# Patient Record
Sex: Female | Born: 1985 | Race: White | Hispanic: No | Marital: Married | Smoking: Never smoker
Health system: Southern US, Community
[De-identification: ages and names within clinical notes are randomized; demographics above are authoritative.]

## PROBLEM LIST (undated history)

## (undated) ENCOUNTER — Inpatient Hospital Stay (HOSPITAL_COMMUNITY): Payer: Self-pay

## (undated) DIAGNOSIS — D649 Anemia, unspecified: Secondary | ICD-10-CM

## (undated) DIAGNOSIS — J069 Acute upper respiratory infection, unspecified: Secondary | ICD-10-CM

## (undated) DIAGNOSIS — R51 Headache: Secondary | ICD-10-CM

## (undated) DIAGNOSIS — H9319 Tinnitus, unspecified ear: Secondary | ICD-10-CM

## (undated) DIAGNOSIS — Z8619 Personal history of other infectious and parasitic diseases: Secondary | ICD-10-CM

## (undated) DIAGNOSIS — N390 Urinary tract infection, site not specified: Secondary | ICD-10-CM

## (undated) HISTORY — PX: WISDOM TOOTH EXTRACTION: SHX21

## (undated) HISTORY — DX: Headache: R51

## (undated) HISTORY — DX: Tinnitus, unspecified ear: H93.19

## (undated) HISTORY — DX: Personal history of other infectious and parasitic diseases: Z86.19

## (undated) HISTORY — DX: Acute upper respiratory infection, unspecified: J06.9

## (undated) HISTORY — DX: Urinary tract infection, site not specified: N39.0

## (undated) HISTORY — PX: OTHER SURGICAL HISTORY: SHX169

---
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
## 2005-08-24 ENCOUNTER — Ambulatory Visit (HOSPITAL_COMMUNITY): Admission: RE | Admit: 2005-08-24 | Discharge: 2005-08-24 | Payer: Self-pay | Admitting: Internal Medicine

## 2010-08-07 ENCOUNTER — Inpatient Hospital Stay (HOSPITAL_COMMUNITY)
Admission: AD | Admit: 2010-08-07 | Discharge: 2010-08-10 | Payer: Self-pay | Source: Home / Self Care | Attending: Obstetrics and Gynecology | Admitting: Obstetrics and Gynecology

## 2010-08-15 LAB — CBC
HCT: 36 % (ref 36.0–46.0)
HCT: 29.5 % — ABNORMAL LOW (ref 36.0–46.0)
Hemoglobin: 12.4 g/dL (ref 12.0–15.0)
Hemoglobin: 10.2 g/dL — ABNORMAL LOW (ref 12.0–15.0)
MCH: 31 pg (ref 26.0–34.0)
MCH: 31.3 pg (ref 26.0–34.0)
MCHC: 34.4 g/dL (ref 30.0–36.0)
MCHC: 34.6 g/dL (ref 30.0–36.0)
MCV: 90 fL (ref 78.0–100.0)
MCV: 90.5 fL (ref 78.0–100.0)
Platelets: 177 10*3/uL (ref 150–400)
Platelets: 186 10*3/uL (ref 150–400)
RBC: 4 MIL/uL (ref 3.87–5.11)
RBC: 3.26 MIL/uL — ABNORMAL LOW (ref 3.87–5.11)
RDW: 12.6 % (ref 11.5–15.5)
RDW: 12.8 % (ref 11.5–15.5)
WBC: 9 10*3/uL (ref 4.0–10.5)
WBC: 19.3 10*3/uL — ABNORMAL HIGH (ref 4.0–10.5)

## 2010-08-15 LAB — RPR: RPR Ser Ql: NONREACTIVE

## 2011-08-01 NOTE — L&D Delivery Note (Signed)
After amniotomy the patient rapidly completed the first stage without difficulty.  Second stage was brief. She delivered one live viable white female in ROA position over an intact perineum. Placenta S/I. EBL-400cc. Baby to NBN.

## 2011-08-31 ENCOUNTER — Ambulatory Visit: Payer: Self-pay | Admitting: Family

## 2011-08-31 ENCOUNTER — Encounter: Payer: Self-pay | Admitting: Family

## 2011-08-31 ENCOUNTER — Ambulatory Visit (INDEPENDENT_AMBULATORY_CARE_PROVIDER_SITE_OTHER): Payer: Self-pay | Admitting: Family

## 2011-08-31 VITALS — BP 120/80 | Temp 97.9°F | Ht 61.5 in | Wt 151.0 lb

## 2011-08-31 DIAGNOSIS — L259 Unspecified contact dermatitis, unspecified cause: Secondary | ICD-10-CM

## 2011-08-31 MED ORDER — PREDNISONE 20 MG PO TABS: ORAL_TABLET | ORAL | Status: AC

## 2011-08-31 NOTE — Progress Notes (Signed)
  Subjective:    Patient ID: Angie Sanchez, female    DOB: 01/13/1986, 26 y.o.   MRN: [id number]  HPI 26 year old white female, new patient, and family complaints of a rash generalized all over her body has been present for 2-3 days. He's been taking Benadryl with no relief. Describes the rash as red, itchy, and will. She denies any known allergens. She denies any changes in detergents, soaps, or lotion. She has had a reaction like this in the past, but the source was never identified. Patient denies any lightheadedness, dizziness, chest pain, palpitations, shortness of breath.   Review of Systems  Constitutional: Negative.   Eyes: Negative.   Respiratory: Negative.   Cardiovascular: Negative.   Gastrointestinal: Negative.   Genitourinary: Negative.   Musculoskeletal: Negative.   Skin: Positive for rash.  Neurological: Negative.   Hematological: Negative.   Psychiatric/Behavioral: Negative.    Past Medical History  Diagnosis Date  . Urinary tract infection     History   Social History  . Marital Status: Married    Spouse Name: N/A    Number of Children: N/A  . Years of Education: N/A   Occupational History  . Not on file.   Social History Main Topics  . Smoking status: Never Smoker   . Smokeless tobacco: Not on file  . Alcohol Use: Yes     rarely  . Drug Use: No  . Sexually Active: Not on file   Other Topics Concern  . Not on file   Social History Narrative  . No narrative on file    Past Surgical History  Procedure Date  . Wisdom tooth extraction   . Other surgical history 2001-2002    foot, knee, and finger (granulomas)    Family History  Problem Relation Age of Onset  . Hypertension Mother   . Depression Mother   . Hyperlipidemia Father   . Depression Father   . Arthritis Maternal Grandmother   . Hypertension Maternal Grandmother   . Arthritis Maternal Grandfather   . Hypertension Maternal Grandfather   . Cancer Cousin     breast    No  Known Allergies  No current outpatient prescriptions on file prior to visit.    BP 120/80  Temp(Src) 97.9 F (36.6 C) (Oral)  Ht 5' 1.5" (1.562 m)  Wt 151 lb (68.493 kg)  BMI 28.07 kg/m2chart   Objective:   Physical Exam  Constitutional: She is oriented to person, place, and time. She appears well-developed and well-nourished.  Neck: Normal range of motion. Neck supple.  Cardiovascular: Normal rate, regular rhythm and normal heart sounds.   Pulmonary/Chest: Effort normal and breath sounds normal.  Neurological: She is alert and oriented to person, place, and time.  Skin: Skin is warm and dry. Rash noted. There is erythema.       Generalized rash over the body, there is a red, whelped and excoriated. No signs or symptoms of infection.  Psychiatric: She has a normal mood and affect.          Assessment & Plan:  Assessment: Contact dermatitis  Plan: Prednisone 60 mg by mouth every morning x3 days, 40 mg by mouth every morning x3 days, 20 mg by mouth every morning x3 days. Benadryl when necessary. Drink plenty of fluids. Patient all the office if symptoms worsen or persist. Recheck as scheduled, and when necessary.

## 2011-08-31 NOTE — Patient Instructions (Signed)
Contact Dermatitis Contact dermatitis is a reaction to certain substances that touch the skin. Contact dermatitis can be either irritant contact dermatitis or allergic contact dermatitis. Irritant contact dermatitis does not require previous exposure to the substance for a reaction to occur. Allergic contact dermatitis only occurs if you have been exposed to the substance before. Upon a repeat exposure, your body reacts to the substance.   CAUSES   Many substances can cause contact dermatitis. Irritant dermatitis is most commonly caused by repeated exposure to mildly irritating substances, such as:  Makeup.     Soaps.    Detergents.    Bleaches.    Acids.    Metal salts, such as nickel.  Allergic contact dermatitis is most commonly caused by exposure to:  Poisonous plants.     Chemicals (deodorants, shampoos).     Jewelry.    Latex.    Neomycin in triple antibiotic cream.     Preservatives in products, including clothing.  SYMPTOMS   The area of skin that is exposed may develop:  Dryness or flaking.     Redness.    Cracks.    Itching.    Pain or a burning sensation.     Blisters.  With allergic contact dermatitis, there may also be swelling in areas such as the eyelids, mouth, or genitals.   DIAGNOSIS   Your caregiver can usually tell what the problem is by doing a physical exam. In cases where the cause is uncertain and an allergic contact dermatitis is suspected, a patch skin test may be performed to help determine the cause of your dermatitis. TREATMENT Treatment includes protecting the skin from further contact with the irritating substance by avoiding that substance if possible. Barrier creams, powders, and gloves may be helpful. Your caregiver may also recommend:  Steroid creams or ointments applied 2 times daily. For best results, soak the rash area in cool water for 20 minutes. Then apply the medicine. Cover the area with a plastic wrap. You can store the  steroid cream in the refrigerator for a "chilly" effect on your rash. That may decrease itching. Oral steroid medicines may be needed in more severe cases.     Antibiotics or antibacterial ointments if a skin infection is present.     Antihistamine lotion or an antihistamine taken by mouth to ease itching.     Lubricants to keep moisture in your skin.     Burow's solution to reduce redness and soreness or to dry a weeping rash. Mix one packet or tablet of solution in 2 cups cool water. Dip a clean washcloth in the mixture, wring it out a bit, and put it on the affected area. Leave the cloth in place for 30 minutes. Do this as often as possible throughout the day.     Taking several cornstarch or baking soda baths daily if the area is too large to cover with a washcloth.  Harsh chemicals, such as alkalis or acids, can cause skin damage that is like a burn. You should flush your skin for 15 to 20 minutes with cold water after such an exposure. You should also seek immediate medical care after exposure. Bandages (dressings), antibiotics, and pain medicine may be needed for severely irritated skin.   HOME CARE INSTRUCTIONS  Avoid the substance that caused your reaction.     Keep the area of skin that is affected away from hot water, soap, sunlight, chemicals, acidic substances, or anything else that would irritate your skin.       Do not scratch the rash. Scratching may cause the rash to become infected.     You may take cool baths to help stop the itching.     Only take over-the-counter or prescription medicines as directed by your caregiver.     See your caregiver for follow-up care as directed to make sure your skin is healing properly.  SEEK MEDICAL CARE IF:    Your condition is not better after 3 days of treatment.     You seem to be getting worse.     You see signs of infection such as swelling, tenderness, redness, soreness, or warmth in the affected area.     You have any problems  related to your medicines.  Document Released: 07/14/2000 Document Revised: 03/29/2011 Document Reviewed: 12/20/2010 Exit[employer] Patient Information 2012 [employer]. 

## 2011-10-26 ENCOUNTER — Encounter (INDEPENDENT_AMBULATORY_CARE_PROVIDER_SITE_OTHER): Payer: Self-pay | Admitting: Obstetrics and Gynecology

## 2011-10-26 DIAGNOSIS — IMO0001 Reserved for inherently not codable concepts without codable children: Secondary | ICD-10-CM

## 2011-11-14 LAB — OB RESULTS CONSOLE RPR: RPR: NONREACTIVE

## 2011-11-14 LAB — OB RESULTS CONSOLE HIV ANTIBODY (ROUTINE TESTING): HIV: NONREACTIVE

## 2011-11-15 LAB — OB RESULTS CONSOLE RUBELLA ANTIBODY, IGM: Rubella: IMMUNE

## 2011-11-15 LAB — OB RESULTS CONSOLE GC/CHLAMYDIA
Chlamydia: NEGATIVE
Gonorrhea: NEGATIVE

## 2011-11-15 LAB — OB RESULTS CONSOLE ABO/RH: RH Type: NEGATIVE

## 2011-11-15 LAB — OB RESULTS CONSOLE HEPATITIS B SURFACE ANTIGEN: Hepatitis B Surface Ag: NEGATIVE

## 2011-11-15 LAB — OB RESULTS CONSOLE ANTIBODY SCREEN: Antibody Screen: NEGATIVE

## 2011-11-15 LAB — OB RESULTS CONSOLE HIV ANTIBODY (ROUTINE TESTING): HIV: NONREACTIVE

## 2011-11-15 LAB — OB RESULTS CONSOLE RPR: RPR: NONREACTIVE

## 2012-04-30 LAB — OB RESULTS CONSOLE GBS: GBS: POSITIVE

## 2012-05-17 ENCOUNTER — Encounter (HOSPITAL_COMMUNITY): Payer: Self-pay | Admitting: *Deleted

## 2012-05-17 ENCOUNTER — Telehealth (HOSPITAL_COMMUNITY): Payer: Self-pay | Admitting: *Deleted

## 2012-05-17 NOTE — Telephone Encounter (Signed)
Preadmission screen  

## 2012-05-23 ENCOUNTER — Inpatient Hospital Stay (HOSPITAL_COMMUNITY)
Admission: RE | Admit: 2012-05-23 | Discharge: 2012-05-26 | DRG: 775 | Disposition: A | Discharge status: Home / Self Care | Payer: Medicaid Other | Source: Ambulatory Visit | Attending: Obstetrics and Gynecology | Admitting: Obstetrics and Gynecology

## 2012-05-23 ENCOUNTER — Encounter (HOSPITAL_COMMUNITY): Payer: Self-pay

## 2012-05-23 VITALS — BP 102/63 | HR 90 | Temp 97.3°F | Resp 18 | Ht 62.0 in | Wt 186.0 lb

## 2012-05-23 DIAGNOSIS — Z22330 Carrier of Group B streptococcus: Secondary | ICD-10-CM

## 2012-05-23 DIAGNOSIS — Z3480 Encounter for supervision of other normal pregnancy, unspecified trimester: Secondary | ICD-10-CM

## 2012-05-23 DIAGNOSIS — O99892 Other specified diseases and conditions complicating childbirth: Secondary | ICD-10-CM | POA: Diagnosis present

## 2012-05-23 DIAGNOSIS — O409XX0 Polyhydramnios, unspecified trimester, not applicable or unspecified: Principal | ICD-10-CM | POA: Diagnosis present

## 2012-05-23 LAB — CBC
HCT: 34.5 % — ABNORMAL LOW (ref 36.0–46.0)
MCHC: 32.5 g/dL (ref 30.0–36.0)
MCV: 83.7 fL (ref 78.0–100.0)
Platelets: 218 10*3/uL (ref 150–400)
RDW: 13 % (ref 11.5–15.5)

## 2012-05-23 MED ORDER — ACETAMINOPHEN 325 MG PO TABS: 650.0000 mg | ORAL_TABLET | ORAL | Status: DC | PRN

## 2012-05-23 MED ORDER — PENICILLIN G POTASSIUM 5000000 UNITS IJ SOLR
2.5000 10*6.[IU] | INTRAVENOUS | Status: DC
  Administered 2012-05-23 – 2012-05-24 (×6): 2.5 10*6.[IU] via INTRAVENOUS
  Filled 2012-05-23 (×11): qty 2.5

## 2012-05-23 MED ORDER — LACTATED RINGERS IV SOLN
INTRAVENOUS | Status: DC
  Administered 2012-05-23 – 2012-05-24 (×2): via INTRAVENOUS

## 2012-05-23 MED ORDER — DIPHENHYDRAMINE HCL 50 MG/ML IJ SOLN: 12.5000 mg | INTRAMUSCULAR | Status: DC | PRN

## 2012-05-23 MED ORDER — IBUPROFEN 600 MG PO TABS
600.0000 mg | ORAL_TABLET | Freq: Four times a day (QID) | ORAL | Status: DC | PRN
  Administered 2012-05-24: 600 mg via ORAL
  Filled 2012-05-23: qty 1

## 2012-05-23 MED ORDER — OXYTOCIN BOLUS FROM INFUSION
500.0000 mL | INTRAVENOUS | Status: DC
  Filled 2012-05-23 (×109): qty 500

## 2012-05-23 MED ORDER — EPHEDRINE 5 MG/ML INJ: 10.0000 mg | INTRAVENOUS | Status: DC | PRN

## 2012-05-23 MED ORDER — PHENYLEPHRINE 40 MCG/ML (10ML) SYRINGE FOR IV PUSH (FOR BLOOD PRESSURE SUPPORT): 80.0000 ug | PREFILLED_SYRINGE | INTRAVENOUS | Status: DC | PRN

## 2012-05-23 MED ORDER — CITRIC ACID-SODIUM CITRATE 334-500 MG/5ML PO SOLN: 30.0000 mL | ORAL | Status: DC | PRN

## 2012-05-23 MED ORDER — OXYTOCIN 40 UNITS IN LACTATED RINGERS INFUSION - SIMPLE MED
1.0000 m[IU]/min | INTRAVENOUS | Status: DC
  Administered 2012-05-23: 10 m[IU]/min via INTRAVENOUS
  Administered 2012-05-23: 2 m[IU]/min via INTRAVENOUS
  Filled 2012-05-23: qty 1000

## 2012-05-23 MED ORDER — OXYCODONE-ACETAMINOPHEN 5-325 MG PO TABS
1.0000 | ORAL_TABLET | ORAL | Status: DC | PRN
  Administered 2012-05-24: 1 via ORAL
  Filled 2012-05-23: qty 1

## 2012-05-23 MED ORDER — OXYTOCIN 40 UNITS IN LACTATED RINGERS INFUSION - SIMPLE MED
62.5000 mL/h | INTRAVENOUS | Status: DC
  Administered 2012-05-24: 62.5 mL/h via INTRAVENOUS

## 2012-05-23 MED ORDER — EPHEDRINE 5 MG/ML INJ
10.0000 mg | INTRAVENOUS | Status: DC | PRN
Start: 2012-05-23 — End: 2012-05-24
  Filled 2012-05-23: qty 4

## 2012-05-23 MED ORDER — PENICILLIN G POTASSIUM 5000000 UNITS IJ SOLR
5.0000 10*6.[IU] | Freq: Once | INTRAVENOUS | Status: AC
  Administered 2012-05-23: 5 10*6.[IU] via INTRAVENOUS
  Filled 2012-05-23: qty 5

## 2012-05-23 MED ORDER — LIDOCAINE HCL (PF) 1 % IJ SOLN: 30.0000 mL | INTRAMUSCULAR | Status: DC | PRN

## 2012-05-23 MED ORDER — FENTANYL 2.5 MCG/ML BUPIVACAINE 1/10 % EPIDURAL INFUSION (WH - ANES)
14.0000 mL/h | INTRAMUSCULAR | Status: DC
  Administered 2012-05-24 (×2): 14 mL/h via EPIDURAL
  Filled 2012-05-23 (×3): qty 125

## 2012-05-23 MED ORDER — LACTATED RINGERS IV SOLN: 500.0000 mL | Freq: Once | INTRAVENOUS | Status: DC

## 2012-05-23 MED ORDER — ONDANSETRON HCL 4 MG/2ML IJ SOLN: 4.0000 mg | Freq: Four times a day (QID) | INTRAMUSCULAR | Status: DC | PRN

## 2012-05-23 MED ORDER — LACTATED RINGERS IV SOLN
500.0000 mL | INTRAVENOUS | Status: DC | PRN
  Administered 2012-05-24: 1000 mL via INTRAVENOUS

## 2012-05-23 MED ORDER — TERBUTALINE SULFATE 1 MG/ML IJ SOLN: 0.2500 mg | Freq: Once | INTRAMUSCULAR | Status: AC | PRN

## 2012-05-23 NOTE — Progress Notes (Signed)
FHTs 130-140s with occ mild to moderate variables, good STV, NST R SVE 1-2/50/-2 ballotable, unable to AROM  Continue pitocin.

## 2012-05-23 NOTE — Progress Notes (Signed)
FHTs 130s gstv, NST R, occ mild variable. SVE 2/70/-2, head closer to cervix.  Pt declines careful AROM at this time, continue pit.

## 2012-05-23 NOTE — H&P (Signed)
26 y.o. [redacted]w[redacted]d  G2P1001 comes in for induction at term for polyhydramnios.  Otherwise has good fetal movement and no bleeding.  Past Medical History  Diagnosis Date  . Urinary tract infection   . H/O varicella     Past Surgical History  Procedure Date  . Wisdom tooth extraction   . Other surgical history 2001-2002    foot, knee, and finger (granulomas)  . Nodule removal     from knee fingers and toes    OB History    Grav Para Term Preterm Abortions TAB SAB Ect Mult Living   2 1 1       1      # Outc Date GA Lbr Len/2nd Wgt Sex Del Anes PTL Lv   1 TRM 2012 [redacted]w[redacted]d 15:00 8lb14oz(4.026kg) M SVD None  Yes   2 CUR               History   Social History  . Marital Status: Married    Spouse Name: N/A    Number of Children: N/A  . Years of Education: N/A   Occupational History  . Not on file.   Social History Main Topics  . Smoking status: Never Smoker   . Smokeless tobacco: Never Used  . Alcohol Use: No     rarely  . Drug Use: No  . Sexually Active: Yes   Other Topics Concern  . Not on file   Social History Narrative  . No narrative on file   Review of patient's allergies indicates no known allergies.   Prenatal Course:  Pt had essentially uncomplicated course until about 33 weeks.  At that time fundal height was elevated and AFI was >90%ile at 24.  Antenatal testing ensued weekly and BPPs were reassuring.  The AFI had climbed at one point to 32 but was 29 at last U/S on Tuesday.  Pt and husband are Rh negative and Rhogam had not been given.  An antibody screen for blood type was performed at about 36 weeks again and with TORCH titers; all tests were negative.  U/S was normal at 20 weeks and no hydrops was noted on subsequent U/S.  Growth was 90%ile at last EFW which was done at 33.  Pt had declined all antenatal testing for Downs and Spina Bifida.  Filed Vitals:   05/23/12 0830  Temp: 98.7 F (37.1 C)  Resp: 20     Lungs/Cor:  NAD Abdomen:  soft, gravid Ex:  no  cords, erythema SVE:  2/50/-2, ballotable FHTs:  160s, good STV, NST R; now four mild to moderate variable decels with ctxes. Toco:  q3-4   A/P   Full term with polyhydramnios; induction with pitocin.  GBS pos- pcn.  Angie Sanchez A

## 2012-05-23 NOTE — Progress Notes (Signed)
Dr. Henderson Cloud called in.  Orders received to reduce pitocin infusion to 46mu/min and then increase by 2 mu/min.   Dr. Henderson Cloud notified that pt wishes to see her.  States she will be in later.

## 2012-05-24 ENCOUNTER — Encounter (HOSPITAL_COMMUNITY): Payer: Self-pay

## 2012-05-24 ENCOUNTER — Encounter (HOSPITAL_COMMUNITY): Payer: Self-pay | Admitting: Anesthesiology

## 2012-05-24 ENCOUNTER — Inpatient Hospital Stay (HOSPITAL_COMMUNITY): Payer: Medicaid Other | Admitting: Anesthesiology

## 2012-05-24 MED ORDER — ONDANSETRON HCL 4 MG PO TABS: 4.0000 mg | ORAL_TABLET | ORAL | Status: DC | PRN

## 2012-05-24 MED ORDER — MEASLES, MUMPS & RUBELLA VAC ~~LOC~~ INJ
0.5000 mL | INJECTION | Freq: Once | SUBCUTANEOUS | Status: DC
  Filled 2012-05-24: qty 0.5

## 2012-05-24 MED ORDER — PRENATAL MULTIVITAMIN CH
1.0000 | ORAL_TABLET | Freq: Every day | ORAL | Status: DC
  Administered 2012-05-24 – 2012-05-26 (×3): 1 via ORAL
  Filled 2012-05-24 (×3): qty 1

## 2012-05-24 MED ORDER — IBUPROFEN 600 MG PO TABS
600.0000 mg | ORAL_TABLET | Freq: Four times a day (QID) | ORAL | Status: DC
  Administered 2012-05-24 – 2012-05-26 (×6): 600 mg via ORAL
  Filled 2012-05-24 (×8): qty 1

## 2012-05-24 MED ORDER — LIDOCAINE HCL (PF) 1 % IJ SOLN
INTRAMUSCULAR | Status: DC | PRN
  Administered 2012-05-24 (×2): 5 mL

## 2012-05-24 MED ORDER — DIBUCAINE 1 % RE OINT
1.0000 "application " | TOPICAL_OINTMENT | RECTAL | Status: DC | PRN
  Administered 2012-05-25: 1 via RECTAL
  Filled 2012-05-24: qty 28

## 2012-05-24 MED ORDER — ONDANSETRON HCL 4 MG/2ML IJ SOLN: 4.0000 mg | INTRAMUSCULAR | Status: DC | PRN

## 2012-05-24 MED ORDER — BENZOCAINE-MENTHOL 20-0.5 % EX AERO
1.0000 "application " | INHALATION_SPRAY | CUTANEOUS | Status: DC | PRN
  Filled 2012-05-24: qty 56

## 2012-05-24 MED ORDER — ZOLPIDEM TARTRATE 5 MG PO TABS: 5.0000 mg | ORAL_TABLET | Freq: Every evening | ORAL | Status: DC | PRN

## 2012-05-24 MED ORDER — TETANUS-DIPHTH-ACELL PERTUSSIS 5-2.5-18.5 LF-MCG/0.5 IM SUSP: 0.5000 mL | Freq: Once | INTRAMUSCULAR | Status: DC

## 2012-05-24 MED ORDER — OXYCODONE-ACETAMINOPHEN 5-325 MG PO TABS: 1.0000 | ORAL_TABLET | ORAL | Status: DC | PRN

## 2012-05-24 MED ORDER — SIMETHICONE 80 MG PO CHEW: 80.0000 mg | CHEWABLE_TABLET | ORAL | Status: DC | PRN

## 2012-05-24 MED ORDER — WITCH HAZEL-GLYCERIN EX PADS
1.0000 "application " | MEDICATED_PAD | CUTANEOUS | Status: DC | PRN
  Administered 2012-05-25: 1 via TOPICAL

## 2012-05-24 NOTE — Anesthesia Postprocedure Evaluation (Signed)
  Anesthesia Post Note  Patient: Angie Sanchez  Procedure(s) Performed: * No procedures listed *  Anesthesia type: Epidural  Patient location: Mother/Baby  Post pain: Pain level controlled  Post assessment: Post-op Vital signs reviewed  Last Vitals:  Filed Vitals:   05/24/12 1400  BP: 115/69  Pulse: 78  Temp: 36.7 C  Resp: 18    Post vital signs: Reviewed  Level of consciousness:alert  Complications: No apparent anesthesia complications

## 2012-05-24 NOTE — Anesthesia Procedure Notes (Signed)
Epidural Patient location during procedure: OB Start time: 05/24/2012 3:04 AM  Staffing Anesthesiologist: Brayton Caves R Performed by: anesthesiologist   Preanesthetic Checklist Completed: patient identified, site marked, surgical consent, pre-op evaluation, timeout performed, IV checked, risks and benefits discussed and monitors and equipment checked  Epidural Patient position: sitting Prep: site prepped and draped and DuraPrep Patient monitoring: continuous pulse ox and blood pressure Approach: midline Injection technique: LOR air and LOR saline  Needle:  Needle type: Tuohy  Needle gauge: 17 G Needle length: 9 cm and 9 Needle insertion depth: 5 cm cm Catheter type: closed end flexible Catheter size: 19 Gauge Catheter at skin depth: 10 cm Test dose: negative  Assessment Events: blood not aspirated, injection not painful, no injection resistance, negative IV test and no paresthesia  Additional Notes Patient identified.  Risk benefits discussed including failed block, incomplete pain control, headache, nerve damage, paralysis, blood pressure changes, nausea, vomiting, reactions to medication both toxic or allergic, and postpartum back pain.  Patient expressed understanding and wished to proceed.  All questions were answered.  Sterile technique used throughout procedure and epidural site dressed with sterile barrier dressing. No paresthesia or other complications noted.The patient did not experience any signs of intravascular injection such as tinnitus or metallic taste in mouth nor signs of intrathecal spread such as rapid motor block. Please see nursing notes for vital signs.

## 2012-05-24 NOTE — Progress Notes (Signed)
Delivery of live viable female by Dr Dareen Piano. APGARs 8,9

## 2012-05-24 NOTE — Anesthesia Preprocedure Evaluation (Signed)

## 2012-05-24 NOTE — Progress Notes (Signed)
3.5/80/-2 NST R Pt declined AROM.

## 2012-05-25 LAB — CBC
HCT: 29.3 % — ABNORMAL LOW (ref 36.0–46.0)
Hemoglobin: 9.7 g/dL — ABNORMAL LOW (ref 12.0–15.0)
MCH: 28 pg (ref 26.0–34.0)
MCHC: 33.1 g/dL (ref 30.0–36.0)
RDW: 13.1 % (ref 11.5–15.5)

## 2012-05-25 LAB — ABO/RH: ABO/RH(D): A NEG

## 2012-05-25 MED ORDER — HYDROCORTISONE 1 % EX CREA
TOPICAL_CREAM | Freq: Three times a day (TID) | CUTANEOUS | Status: DC
  Administered 2012-05-26: 02:00:00 via TOPICAL
  Filled 2012-05-25: qty 28

## 2012-05-25 NOTE — Progress Notes (Signed)
PPD#1 Pt without c/o. VSSAF IMP/ doing well Plan/ will discharge in am

## 2012-05-26 NOTE — Progress Notes (Signed)
PPD#2 Pt has no c/o. Ready for discharge. PLAN/ will discharge

## 2012-05-26 NOTE — Discharge Summary (Signed)
Obstetric Discharge Summary Reason for Admission: induction of labor Prenatal Procedures: ultrasound Intrapartum Procedures: spontaneous vaginal delivery Postpartum Procedures: none Complications-Operative and Postpartum: none Hemoglobin  Date Value Range Status  05/25/2012 9.7* 12.0 - 15.0 g/dL Final     HCT  Date Value Range Status  05/25/2012 29.3* 36.0 - 46.0 % Final    Physical Exam:  General: alert Lochia: appropriate Uterine Fundus: firm   Discharge Diagnoses: Term Pregnancy-delivered  Discharge Information: Date: 05/26/2012 Activity: pelvic rest Diet: routine Medications: PNV and Ibuprofen Condition: stable Instructions: refer to practice specific booklet Discharge to: home Follow-up Information    Follow up with Angie Aland, MD. Schedule an appointment as soon as possible for a visit in 4 weeks.   Contact information:   [address] [place] [address] [phone]          Newborn Data: Live born female  Birth Weight: 9 lb 2 oz (4139 g) APGAR: 8, 9  Home with mother.  Sanchez,Angie E 05/26/2012, 8:43 AM

## 2012-05-27 NOTE — Progress Notes (Signed)
Post discharge chart review completed.  

## 2012-05-29 ENCOUNTER — Inpatient Hospital Stay (HOSPITAL_COMMUNITY): Admission: AD | Admit: 2012-05-29 | Payer: Self-pay | Source: Ambulatory Visit | Admitting: Obstetrics and Gynecology

## 2012-10-18 ENCOUNTER — Ambulatory Visit (INDEPENDENT_AMBULATORY_CARE_PROVIDER_SITE_OTHER): Payer: Self-pay | Admitting: Family

## 2012-10-18 ENCOUNTER — Encounter: Payer: Self-pay | Admitting: Family

## 2012-10-18 VITALS — BP 100/60 | HR 79 | Wt 154.0 lb

## 2012-10-18 DIAGNOSIS — R002 Palpitations: Secondary | ICD-10-CM

## 2012-10-18 DIAGNOSIS — I491 Atrial premature depolarization: Secondary | ICD-10-CM

## 2012-10-18 LAB — COMPREHENSIVE METABOLIC PANEL
ALT: 21 U/L (ref 0–35)
AST: 24 U/L (ref 0–37)
Albumin: 4.2 g/dL (ref 3.5–5.2)
Alkaline Phosphatase: 64 U/L (ref 39–117)
Potassium: 4 mEq/L (ref 3.5–5.1)
Sodium: 139 mEq/L (ref 135–145)
Total Bilirubin: 0.7 mg/dL (ref 0.3–1.2)
Total Protein: 7.6 g/dL (ref 6.0–8.3)

## 2012-10-18 LAB — CBC WITH DIFFERENTIAL/PLATELET
Basophils Relative: 0.6 % (ref 0.0–3.0)
Eosinophils Absolute: 0.1 10*3/uL (ref 0.0–0.7)
Lymphocytes Relative: 38.1 % (ref 12.0–46.0)
MCHC: 33.9 g/dL (ref 30.0–36.0)
MCV: 85.1 fl (ref 78.0–100.0)
Monocytes Absolute: 0.3 10*3/uL (ref 0.1–1.0)
Neutrophils Relative %: 52.4 % (ref 43.0–77.0)
Platelets: 237 10*3/uL (ref 150.0–400.0)
RBC: 4.64 Mil/uL (ref 3.87–5.11)
WBC: 4.8 10*3/uL (ref 4.5–10.5)

## 2012-10-18 LAB — TSH: TSH: 1.62 u[IU]/mL (ref 0.35–5.50)

## 2012-10-18 NOTE — Patient Instructions (Addendum)
Premature Beats A premature beat is an extra heartbeat that happens earlier than normal. Premature beats are called premature atrial contractions (PACs) or premature ventricular contractions (PVCs) depending on the area of the heart where they start. CAUSES  Premature beats may be brought on by a variety of factors including:  Emotional stress.  Lack of sleep.  Caffeine.  Asthma medicines.  Stimulants.  Herbal teas.  Dietary supplements.  Alcohol. In most cases, premature beats are not dangerous and are not a sign of serious heart disease. Most patients evaluated for premature beats have completely normal heart function. Rarely, premature beats may be a sign of more significant heart problems or medical illness. SYMPTOMS  Premature beats may cause palpitations. This means you feel like your heart is skipping a beat or beating harder than usual. Sometimes, slight chest pain occurs with premature beats, lasting only a few seconds. This pain has been described as a "flopping" feeling inside the chest. In many cases, premature beats do not cause any symptoms and they are only detected when an electrocardiography test (EKG) or heart monitoring is performed. DIAGNOSIS  Your caregiver may run some tests to evaluate your heart such as an EKG or echocardiography. You may need to wear a portable heart monitor for several days to record the electrical activity of your heart. Blood testing may also be performed to check your electrolytes and thyroid function. TREATMENT  Premature beats usually go away with rest. If the problem continues, your caregiver will determine a treatment plan for you.  HOME CARE INSTRUCTIONS  Get plenty of rest over the next few days until your symptoms improve.  Avoid coffee, tea, alcohol, and soda (pop, cola).  Do not smoke. SEEK MEDICAL CARE IF:  Your symptoms continue after 1 to 2 days of rest.  You have new symptoms, such as chest pain or trouble  breathing. SEEK IMMEDIATE MEDICAL CARE IF:  You have severe chest pain or abdominal pain.  You have pain that radiates into the neck, arm, or jaw.  You faint or have extreme weakness.  You have shortness of breath.  Your heartbeat races for more than 5 seconds. MAKE SURE YOU:  Understand these instructions.  Will watch your condition.  Will get help right away if you are not doing well or get worse. Document Released: 08/24/2004 Document Revised: 10/09/2011 Document Reviewed: 03/20/2011 [employer] Patient Information 2013 [employer].  

## 2012-10-18 NOTE — Progress Notes (Signed)
Subjective:    Patient ID: Angie Sanchez, female    DOB: 12/19/1985, 27 y.o.   MRN: [id number]  HPI 27 year old white female, non-smoker, patient of Dr. Blair Dolphin who presents to the office today with complaints that she has been feeling her heart flutter x 6 days, lasting for brief second at random. She reports she had the same experience while in college approximately 8 years ago and underwent a cardiac work up and wore a heart monitor; reports negative cardiac findings. States fluttering resolved and she has not had them again until a week ago. 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 She reports increased stress with caring for children, is sleeping approximately 5 hours nightly by choice, denies difficulties going to sleep or frequent awakening.     Review of Systems  Constitutional: Negative.   HENT: Negative.   Eyes: Negative.   Respiratory: Negative.   Cardiovascular: Negative.        Denies any episodes of chest pain or SOB.  Gastrointestinal: Negative.   Endocrine: Negative.   Genitourinary: Negative.   Musculoskeletal: Negative.   Skin: Negative.   Neurological: Negative.   Hematological: Negative.   Psychiatric/Behavioral: Negative for suicidal ideas. The patient is nervous/anxious.    Past Medical History  Diagnosis Date  . Urinary tract infection   . H/O varicella     History   Social History  . Marital Status: Married    Spouse Name: N/A    Number of Children: N/A  . Years of Education: N/A   Occupational History  . Not on file.   Social History Main Topics  . Smoking status: Never Smoker   . Smokeless tobacco: Never Used  . Alcohol Use: No     Comment: rarely  . Drug Use: No  . Sexually Active: Yes   Other Topics Concern  . Not on file   Social History Narrative  . No narrative on file    Past Surgical History  Procedure Laterality Date  .  Wisdom tooth extraction    . Other surgical history  2001-2002    foot, knee, and finger (granulomas)  . Nodule removal      from knee fingers and toes    Family History  Problem Relation Age of Onset  . Hypertension Mother   . Depression Mother   . Hyperlipidemia Father   . Depression Father   . Arthritis Maternal Grandmother   . Hypertension Maternal Grandmother   . Arthritis Maternal Grandfather   . Diabetes Maternal Grandfather   . Heart disease Maternal Grandfather   . Hypertension Maternal Grandfather   . Depression Maternal Grandfather   . Cancer Cousin     breast  . Hypertension Paternal Grandmother   . COPD Paternal Grandmother     No Known Allergies  Current Outpatient Prescriptions on File Prior to Visit  Medication Sig Dispense Refill  . acetaminophen (TYLENOL) 325 MG tablet Take 650 mg by mouth every 6 (six) hours as needed. pain      . Prenatal Vit-Fe Fumarate-FA (PRENATAL MULTIVITAMIN) TABS Take 1 tablet by mouth daily.       No current facility-administered medications on file prior to visit.    BP 100/60  Pulse 79  Wt 154 lb (69.854 kg)  BMI 28.16 kg/m2  SpO2 97%chart    Objective:   Physical Exam  Constitutional: She appears  well-developed and well-nourished.  HENT:  Head: Normocephalic.  Right Ear: External ear normal.  Left Ear: External ear normal.  Nose: Nose normal.  Mouth/Throat: Oropharynx is clear and moist.  Eyes: Conjunctivae and EOM are normal. Pupils are equal, round, and reactive to light.  Neck: Normal range of motion. Neck supple.  Cardiovascular: Normal rate, regular rhythm, normal heart sounds and intact distal pulses.   Pulmonary/Chest: Effort normal and breath sounds normal.  Abdominal: Soft. Bowel sounds are normal.  Musculoskeletal: Normal range of motion.  Psychiatric: She has a normal mood and affect. Her behavior is normal. Judgment and thought content normal.   EKG normal sinus rhythm with PAC's.        Assessment & Plan:  Assessment: 1. Palpitations 2. Premature Atrial Contraction  Plan: Ordered BMP, TSH, CBC. Will follow up with patient with pending lab results. Provided information on PAC's and discussed likeliness of PAC's resulting from anxiety, stress, lack of sleep and caffeine. Lifestyle modifications suggested.

## 2013-04-17 ENCOUNTER — Encounter: Payer: Self-pay | Admitting: Family

## 2013-04-17 ENCOUNTER — Ambulatory Visit (INDEPENDENT_AMBULATORY_CARE_PROVIDER_SITE_OTHER): Payer: Self-pay | Admitting: Family

## 2013-04-17 VITALS — BP 100/62 | HR 86 | Wt 142.0 lb

## 2013-04-17 DIAGNOSIS — N926 Irregular menstruation, unspecified: Secondary | ICD-10-CM

## 2013-04-17 DIAGNOSIS — R209 Unspecified disturbances of skin sensation: Secondary | ICD-10-CM

## 2013-04-17 DIAGNOSIS — R200 Anesthesia of skin: Secondary | ICD-10-CM

## 2013-04-17 DIAGNOSIS — M2550 Pain in unspecified joint: Secondary | ICD-10-CM

## 2013-04-18 ENCOUNTER — Encounter: Payer: Self-pay | Admitting: Family

## 2013-04-18 LAB — BASIC METABOLIC PANEL
CO2: 26 mEq/L (ref 19–32)
Calcium: 9.2 mg/dL (ref 8.4–10.5)
Creatinine, Ser: 0.6 mg/dL (ref 0.4–1.2)
GFR: 120.55 mL/min (ref >60.00)

## 2013-04-18 LAB — HCG, QUANTITATIVE, PREGNANCY: hCG, Beta Chain, Quant, S: 0.36 m[IU]/mL

## 2013-04-18 LAB — CBC WITH DIFFERENTIAL/PLATELET
Basophils Absolute: 0 10*3/uL (ref 0.0–0.1)
HCT: 39 % (ref 36.0–46.0)
Hemoglobin: 13.3 g/dL (ref 12.0–15.0)
Lymphs Abs: 2.3 10*3/uL (ref 0.7–4.0)
MCHC: 34.1 g/dL (ref 30.0–36.0)
MCV: 85.3 fl (ref 78.0–100.0)
Monocytes Absolute: 0.5 10*3/uL (ref 0.1–1.0)
Monocytes Relative: 7 % (ref 3.0–12.0)
Neutro Abs: 4.1 10*3/uL (ref 1.4–7.7)
Platelets: 241 10*3/uL (ref 150.0–400.0)
RDW: 13.2 % (ref 11.5–14.6)

## 2013-04-18 NOTE — Progress Notes (Signed)
Subjective:    Patient ID: Angie Sanchez, female    DOB: 04/12/1986, 27 y.o.   MRN: [id number]  HPI  27 year old white female, nonsmoker is in today with complaints multiple joint pain, numbness in arms bilaterally, and hands feel like they have pressure in them ongoing x1-2 weeks. She is 10 months postpartum. Reports doing well. Normal mood. No depression or anxiety no helplessness or hopelessness. Denies any previous symptoms similar.  Review of Systems  Constitutional: Negative.   HENT: Negative.   Respiratory: Negative.   Cardiovascular: Negative.   Gastrointestinal: Negative.   Endocrine: Negative.   Musculoskeletal: Positive for arthralgias. Negative for back pain.  Allergic/Immunologic: Negative.   Neurological: Positive for numbness. Negative for dizziness and weakness.  Hematological: Negative.   Psychiatric/Behavioral: Negative.    Past Medical History  Diagnosis Date  . Urinary tract infection   . H/O varicella     History   Social History  . Marital Status: Married    Spouse Name: N/A    Number of Children: N/A  . Years of Education: N/A   Occupational History  . Not on file.   Social History Main Topics  . Smoking status: Never Smoker   . Smokeless tobacco: Never Used  . Alcohol Use: No     Comment: rarely  . Drug Use: No  . Sexual Activity: Yes   Other Topics Concern  . Not on file   Social History Narrative  . No narrative on file    Past Surgical History  Procedure Laterality Date  . Wisdom tooth extraction    . Other surgical history  2001-2002    foot, knee, and finger (granulomas)  . Nodule removal      from knee fingers and toes    Family History  Problem Relation Age of Onset  . Hypertension Mother   . Depression Mother   . Hyperlipidemia Father   . Depression Father   . Arthritis Maternal Grandmother   . Hypertension Maternal Grandmother   . Arthritis Maternal Grandfather   . Diabetes Maternal Grandfather   . Heart  disease Maternal Grandfather   . Hypertension Maternal Grandfather   . Depression Maternal Grandfather   . Cancer Cousin     breast  . Hypertension Paternal Grandmother   . COPD Paternal Grandmother     No Known Allergies  Current Outpatient Prescriptions on File Prior to Visit  Medication Sig Dispense Refill  . acetaminophen (TYLENOL) 325 MG tablet Take 650 mg by mouth every 6 (six) hours as needed. pain      . Prenatal Vit-Fe Fumarate-FA (PRENATAL MULTIVITAMIN) TABS Take 1 tablet by mouth daily.       No current facility-administered medications on file prior to visit.    BP 100/62  Pulse 86  Wt 142 lb (64.411 kg)  BMI 25.97 kg/m2chart    Objective:   Physical Exam  Constitutional: She is oriented to person, place, and time. She appears well-developed and well-nourished.  HENT:  Right Ear: External ear normal.  Left Ear: External ear normal.  Nose: Nose normal.  Mouth/Throat: Oropharynx is clear and moist.  Neck: Normal range of motion. Neck supple. No thyromegaly present.  Cardiovascular: Normal rate, regular rhythm and normal heart sounds.   Pulmonary/Chest: Effort normal and breath sounds normal.  Musculoskeletal: Normal range of motion.  Neurological: She is alert and oriented to person, place, and time. She has normal reflexes. She displays normal reflexes. No cranial nerve deficit. Coordination normal.  Skin: Skin  is warm and dry.  Psychiatric: She has a normal mood and affect.          Assessment & Plan:  Assessment: 1. Multiple joint pain 2. Bilateral arm numbness  Plan: I have sent to include CBC, TSH, BMP, and they will notify patient and the results. Encouraged healthy diet, exercise. Followup in the results of the labs and sooner as needed.

## 2013-05-26 ENCOUNTER — Encounter: Payer: Self-pay | Admitting: Family

## 2013-05-26 ENCOUNTER — Ambulatory Visit (INDEPENDENT_AMBULATORY_CARE_PROVIDER_SITE_OTHER): Payer: Self-pay | Admitting: Family

## 2013-05-26 VITALS — BP 98/60 | HR 79 | Temp 98.2°F | Wt 137.0 lb

## 2013-05-26 DIAGNOSIS — J322 Chronic ethmoidal sinusitis: Secondary | ICD-10-CM

## 2013-05-26 DIAGNOSIS — J309 Allergic rhinitis, unspecified: Secondary | ICD-10-CM

## 2013-05-26 MED ORDER — FLUTICASONE PROPIONATE 50 MCG/ACT NA SUSP: 2.0000 | Freq: Every day | NASAL | Status: DC

## 2013-05-26 MED ORDER — AMOXICILLIN 500 MG PO TABS: 1000.0000 mg | ORAL_TABLET | Freq: Two times a day (BID) | ORAL | Status: AC

## 2013-05-26 NOTE — Patient Instructions (Signed)

## 2013-05-26 NOTE — Progress Notes (Signed)
Subjective:    Patient ID: Angie Sanchez, female    DOB: Jul 02, 1986, 27 y.o.   MRN: [id number]  HPI 27 year old white female, nonsmoker is in today with complaints of ear pain, nasal congestion, sinus pressure ongoing for 2 weeks. She taken Sudafed and ibuprofen with no relief. Cough is productive with green thick phlegm.   Review of Systems  Constitutional: Negative.   HENT: Positive for congestion, postnasal drip, rhinorrhea and sinus pressure.   Respiratory: Positive for cough.   Cardiovascular: Negative.   Musculoskeletal: Negative.   Skin: Negative.   Allergic/Immunologic: Positive for environmental allergies.  Psychiatric/Behavioral: Negative.    Past Medical History  Diagnosis Date  . Urinary tract infection   . H/O varicella     History   Social History  . Marital Status: Married    Spouse Name: N/A    Number of Children: N/A  . Years of Education: N/A   Occupational History  . Not on file.   Social History Main Topics  . Smoking status: Never Smoker   . Smokeless tobacco: Never Used  . Alcohol Use: No     Comment: rarely  . Drug Use: No  . Sexual Activity: Yes   Other Topics Concern  . Not on file   Social History Narrative  . No narrative on file    Past Surgical History  Procedure Laterality Date  . Wisdom tooth extraction    . Other surgical history  2001-2002    foot, knee, and finger (granulomas)  . Nodule removal      from knee fingers and toes    Family History  Problem Relation Age of Onset  . Hypertension Mother   . Depression Mother   . Hyperlipidemia Father   . Depression Father   . Arthritis Maternal Grandmother   . Hypertension Maternal Grandmother   . Arthritis Maternal Grandfather   . Diabetes Maternal Grandfather   . Heart disease Maternal Grandfather   . Hypertension Maternal Grandfather   . Depression Maternal Grandfather   . Cancer Cousin     breast  . Hypertension Paternal Grandmother   . COPD Paternal  Grandmother     No Known Allergies  Current Outpatient Prescriptions on File Prior to Visit  Medication Sig Dispense Refill  . acetaminophen (TYLENOL) 325 MG tablet Take 650 mg by mouth every 6 (six) hours as needed. pain      . ibuprofen (ADVIL,MOTRIN) 200 MG tablet Take 200 mg by mouth every 6 (six) hours as needed for pain.      . Prenatal Vit-Fe Fumarate-FA (PRENATAL MULTIVITAMIN) TABS Take 1 tablet by mouth daily.       No current facility-administered medications on file prior to visit.    BP 98/60  Pulse 79  Temp(Src) 98.2 F (36.8 C) (Oral)  Wt 137 lb (62.143 kg)  BMI 25.05 kg/m2chart    Objective:   Physical Exam  Constitutional: She is oriented to person, place, and time. She appears well-developed and well-nourished.  HENT:  Right Ear: External ear normal.  Left Ear: External ear normal.  Mouth/Throat: Oropharynx is clear and moist.  Nasal congestion bilaterally. Sinus pressure to palpation over ethmoid sinuses bilaterally  Cardiovascular: Normal rate, regular rhythm and normal heart sounds.   Pulmonary/Chest: Effort normal and breath sounds normal.  Neurological: She is alert and oriented to person, place, and time.  Skin: Skin is warm and dry.  Psychiatric: She has a normal mood and affect.  Assessment & Plan:  Assessment: 1. Ethmoid sinusitis 2. Allergic rhinitis  Plan: Amoxicillin 500 mg 2 caps by mouth twice a day x10 days. Flonase 2 sprays in each nostril once a day. Patient upon the office if symptoms worsen or persist. Recheck as scheduled, and as needed.

## 2013-08-21 ENCOUNTER — Encounter: Payer: Self-pay | Admitting: Family Medicine

## 2013-08-21 ENCOUNTER — Ambulatory Visit (INDEPENDENT_AMBULATORY_CARE_PROVIDER_SITE_OTHER): Payer: Self-pay | Admitting: Family Medicine

## 2013-08-21 VITALS — BP 112/70 | HR 84 | Temp 98.4°F | Wt 147.0 lb

## 2013-08-21 DIAGNOSIS — H6690 Otitis media, unspecified, unspecified ear: Secondary | ICD-10-CM

## 2013-08-21 DIAGNOSIS — H6691 Otitis media, unspecified, right ear: Secondary | ICD-10-CM

## 2013-08-21 MED ORDER — AMOXICILLIN 875 MG PO TABS: 875.0000 mg | ORAL_TABLET | Freq: Two times a day (BID) | ORAL | Status: AC

## 2013-08-21 NOTE — Patient Instructions (Signed)
Otitis Media, Adult Otitis media is redness, soreness, and swelling (inflammation) of the middle ear. Otitis media may be caused by allergies or, most commonly, by infection. Often it occurs as a complication of the common cold. SIGNS AND SYMPTOMS Symptoms of otitis media may include:  Earache.  Fever.  Ringing in your ear.  Headache.  Leakage of fluid from the ear. DIAGNOSIS To diagnose otitis media, your health care provider will examine your ear with an otoscope. This is an instrument that allows your health care provider to see into your ear in order to examine your eardrum. Your health care provider also will ask you questions about your symptoms. TREATMENT  Typically, otitis media resolves on its own within 3 5 days. Your health care provider may prescribe medicine to ease your symptoms of pain. If otitis media does not resolve within 5 days or is recurrent, your health care provider may prescribe antibiotic medicines if he or she suspects that a bacterial infection is the cause. HOME CARE INSTRUCTIONS   Take your medicine as directed until it is gone, even if you feel better after the first few days.  Only take over-the-counter or prescription medicines for pain, discomfort, or fever as directed by your health care provider.  Follow up with your health care provider as directed. SEEK MEDICAL CARE IF:  You have otitis media only in one ear or bleeding from your nose or both.  You notice a lump on your neck.  You are not getting better in 3 5 days.  You feel worse instead of better. SEEK IMMEDIATE MEDICAL CARE IF:   You have pain that is not controlled with medicine.  You have swelling, redness, or pain around your ear or stiffness in your neck.  You notice that part of your face is paralyzed.  You notice that the bone behind your ear (mastoid) is tender when you touch it. MAKE SURE YOU:   Understand these instructions.  Will watch your condition.  Will get help  right away if you are not doing well or get worse. Document Released: 04/21/2004 Document Revised: 05/07/2013 Document Reviewed: 02/11/2013 [employer] Patient Information 2014 [employer].  

## 2013-08-21 NOTE — Progress Notes (Signed)
Pre visit review using our clinic review tool, if applicable. No additional management support is needed unless otherwise documented below in the visit note. 

## 2013-08-21 NOTE — Progress Notes (Signed)
   Subjective:    Patient ID: Angie MeigsLaura E Feely, female    DOB: [birth year], 28 y.o.   MRN: [id number]  HPI  Right ear pain. Onset a couple days ago. She's had history of frequent ear infections in the past. Denies any left ear pain . She has occasional ringing and popping sensation. She has had some nasal congestion for the past few days. Denies any hearing loss. No fevers or chills. No exacerbating factors. Denies any significant cough. No ear drainage.  Past Medical History  Diagnosis Date  . Urinary tract infection   . H/O varicella    Past Surgical History  Procedure Laterality Date  . Wisdom tooth extraction    . Other surgical history  2001-2002    foot, knee, and finger (granulomas)  . Nodule removal      from knee fingers and toes    reports that she has never smoked. She has never used smokeless tobacco. She reports that she does not drink alcohol or use illicit drugs. family history includes Arthritis in her maternal grandfather and maternal grandmother; COPD in her paternal grandmother; Cancer in her cousin; Depression in her father, maternal grandfather, and mother; Diabetes in her maternal grandfather; Heart disease in her maternal grandfather; Hyperlipidemia in her father; Hypertension in her maternal grandfather, maternal grandmother, mother, and paternal grandmother. No Known Allergies   Review of Systems  Constitutional: Negative for fever and chills.  HENT: Positive for ear pain. Negative for ear discharge and hearing loss.   Respiratory: Negative for cough.        Objective:   Physical Exam  Constitutional: She appears well-developed and well-nourished.  HENT:  Left Ear: External ear normal.  Mouth/Throat: Oropharynx is clear and moist.  Moderate erythema right eardrum mostly on the handle of the malleus. No bulging. No visible drainage in the canal.  Neck: Neck supple.  Cardiovascular: Normal rate.   Pulmonary/Chest: Effort normal and breath sounds normal.  No respiratory distress. She has no wheezes. She has no rales.  Lymphadenopathy:    She has no cervical adenopathy.          Assessment & Plan:  Acute right otitis media. Amoxicillin 875 mg twice daily for 10 days

## 2013-09-01 ENCOUNTER — Ambulatory Visit (INDEPENDENT_AMBULATORY_CARE_PROVIDER_SITE_OTHER): Payer: Self-pay | Admitting: Family

## 2013-09-01 ENCOUNTER — Encounter: Payer: Self-pay | Admitting: Family

## 2013-09-01 VITALS — BP 104/60 | HR 105 | Temp 98.6°F | Wt 147.0 lb

## 2013-09-01 DIAGNOSIS — H9209 Otalgia, unspecified ear: Secondary | ICD-10-CM

## 2013-09-01 DIAGNOSIS — H6980 Other specified disorders of Eustachian tube, unspecified ear: Secondary | ICD-10-CM

## 2013-09-01 MED ORDER — METHYLPREDNISOLONE 4 MG PO KIT: PACK | ORAL | Status: DC

## 2013-09-01 NOTE — Progress Notes (Signed)
Pre visit review using our clinic review tool, if applicable. No additional management support is needed unless otherwise documented below in the visit note. 

## 2013-09-01 NOTE — Progress Notes (Signed)
Subjective:    Patient ID: Angie Sanchez, female    DOB: October 14, 1985, 28 y.o.   MRN: [id number]  HPI 28 y.o. White female who presents today with chief complain of "ear infection". Pt states that she was seen 2 weeks ago in office for same complaint and was put on amoxicillin; she has finished the amoxicillin but her ear does not feel better. Pt primary complaint is to her right ear, she describes ringing, popping, tenderness and headaches associated with her right ear. Pt also reports popping feeling to her left ear. Pt denies fever, fatigue, weakness, and weight change.     Review of Systems  Constitutional: Negative.   HENT: Positive for congestion and ear pain.   Eyes: Negative.   Respiratory: Negative.   Cardiovascular: Negative.   Gastrointestinal: Negative.   Endocrine: Negative.   Genitourinary: Negative.   Musculoskeletal: Negative.   Skin: Negative.   Allergic/Immunologic: Negative.   Neurological: Positive for headaches.  Hematological: Negative.   Psychiatric/Behavioral: Negative.    Past Medical History  Diagnosis Date  . Urinary tract infection   . H/O varicella     History   Social History  . Marital Status: Married    Spouse Name: N/A    Number of Children: N/A  . Years of Education: N/A   Occupational History  . Not on file.   Social History Main Topics  . Smoking status: Never Smoker   . Smokeless tobacco: Never Used  . Alcohol Use: No     Comment: rarely  . Drug Use: No  . Sexual Activity: Yes   Other Topics Concern  . Not on file   Social History Narrative  . No narrative on file    Past Surgical History  Procedure Laterality Date  . Wisdom tooth extraction    . Other surgical history  2001-2002    foot, knee, and finger (granulomas)  . Nodule removal      from knee fingers and toes    Family History  Problem Relation Age of Onset  . Hypertension Mother   . Depression Mother   . Hyperlipidemia Father   . Depression Father    . Arthritis Maternal Grandmother   . Hypertension Maternal Grandmother   . Arthritis Maternal Grandfather   . Diabetes Maternal Grandfather   . Heart disease Maternal Grandfather   . Hypertension Maternal Grandfather   . Depression Maternal Grandfather   . Cancer Cousin     breast  . Hypertension Paternal Grandmother   . COPD Paternal Grandmother     No Known Allergies  Current Outpatient Prescriptions on File Prior to Visit  Medication Sig Dispense Refill  . acetaminophen (TYLENOL) 325 MG tablet Take 650 mg by mouth every 6 (six) hours as needed. pain      . fluticasone (FLONASE) 50 MCG/ACT nasal spray Place 2 sprays into the nose daily.  16 g  1  . ibuprofen (ADVIL,MOTRIN) 200 MG tablet Take 200 mg by mouth every 6 (six) hours as needed for pain.       No current facility-administered medications on file prior to visit.    BP 104/60  Pulse 105  Temp(Src) 98.6 F (37 C) (Oral)  Wt 147 lb (66.679 kg)chart    Objective:   Physical Exam  Constitutional: She is oriented to person, place, and time. She appears well-developed and well-nourished. She is active.  HENT:  Head: Normocephalic.  Right Ear: There is tenderness.  Left Ear: Hearing, external ear and  ear canal normal.  Nose: Nose normal.  Mouth/Throat: Uvula is midline, oropharynx is clear and moist and mucous membranes are normal.  Right ear canal is inflamed.   Cardiovascular: Normal rate, regular rhythm and normal heart sounds.   Pulmonary/Chest: Effort normal and breath sounds normal.  Abdominal: Soft. Normal appearance and bowel sounds are normal.  Neurological: She is alert and oriented to person, place, and time.  Skin: Skin is warm, dry and intact.  Psychiatric: She has a normal mood and affect. Her speech is normal and behavior is normal.          Assessment & Plan:  28 y.o. White female presents today with chief complain of "ear infection".  Eustachian Tube Dysfunction: Start Medrol pack p.o. To  decrease inflammation.  Follow up: Pt to follow up as needed or if symptoms do not resolve with completion of medication.

## 2013-09-01 NOTE — Patient Instructions (Signed)
Barotitis Media  Barotitis media is inflammation of your middle ear. This occurs when the auditory tube (eustachian tube) leading from the back of your nose (nasopharynx) to your eardrum is blocked. This blockage may result from a cold, environmental allergies, or an upper respiratory infection. Unresolved barotitis media may lead to damage or hearing loss (barotrauma), which may become permanent.  HOME CARE INSTRUCTIONS   · Use medicines as recommended by your health care provider. Over-the-counter medicines will help unblock the canal and can help during times of air travel.  · Do not put anything into your ears to clean or unplug them. Eardrops will not be helpful.  · Do not swim, dive, or fly until your health care provider says it is all right to do so. If these activities are necessary, chewing gum with frequent, forceful swallowing may help. It is also helpful to hold your nose and gently blow to pop your ears for equalizing pressure changes. This forces air into the eustachian tube.  · Only take over-the-counter or prescription medicines for pain, discomfort, or fever as directed by your health care provider.  · A decongestant may be helpful in decongesting the middle ear and make pressure equalization easier.  SEEK MEDICAL CARE IF:  · You experience a serious form of dizziness in which you feel as if the room is spinning and you feel nauseated (vertigo).  · Your symptoms only involve one ear.  SEEK IMMEDIATE MEDICAL CARE IF:   · You develop a severe headache, dizziness, or severe ear pain.  · You have bloody or pus-like drainage from your ears.  · You develop a fever.  · Your problems do not improve or become worse.  MAKE SURE YOU:   · Understand these instructions.  · Will watch your condition.  · Will get help right away if you are not doing well or get worse.  Document Released: 07/14/2000 Document Revised: 05/07/2013 Document Reviewed: 02/11/2013  [employer]® Patient Information ©2014 [employer].

## 2013-09-08 ENCOUNTER — Ambulatory Visit (INDEPENDENT_AMBULATORY_CARE_PROVIDER_SITE_OTHER): Payer: Self-pay | Admitting: Family Medicine

## 2013-09-08 ENCOUNTER — Encounter: Payer: Self-pay | Admitting: Family Medicine

## 2013-09-08 VITALS — BP 110/70 | HR 84 | Temp 98.4°F | Wt 148.0 lb

## 2013-09-08 DIAGNOSIS — H9209 Otalgia, unspecified ear: Secondary | ICD-10-CM

## 2013-09-08 DIAGNOSIS — H9201 Otalgia, right ear: Secondary | ICD-10-CM

## 2013-09-08 DIAGNOSIS — H6590 Unspecified nonsuppurative otitis media, unspecified ear: Secondary | ICD-10-CM

## 2013-09-08 NOTE — Progress Notes (Signed)
Pre visit review using our clinic review tool, if applicable. No additional management support is needed unless otherwise documented below in the visit note. 

## 2013-09-08 NOTE — Progress Notes (Signed)
   Subjective:    Patient ID: Angie MeigsLaura E Montante, female    DOB: 1985/12/05, 28 y.o.   MRN: [id number]  Otalgia  Pertinent negatives include no ear discharge or headaches.    Persistent right ear discomfort. She was seen about 3 weeks ago and had evidence for erythema and presumed early right supper otitis. She was treated with amoxicillin but did not see any symptomatic improvement. She was subsequently seen and diagnosed with eustachian tube dysfunction and given prednisone taper which also has not helped. She has persistent right ear pain. No vertigo. No hearing changes. She has occasional tenderness. No left ear symptoms. Denies any significant nasal congestive symptoms. She's not had a history of recurrent otitis in the past  Past Medical History  Diagnosis Date  . Urinary tract infection   . H/O varicella    Past Surgical History  Procedure Laterality Date  . Wisdom tooth extraction    . Other surgical history  2001-2002    foot, knee, and finger (granulomas)  . Nodule removal      from knee fingers and toes    reports that she has never smoked. She has never used smokeless tobacco. She reports that she does not drink alcohol or use illicit drugs. family history includes Arthritis in her maternal grandfather and maternal grandmother; COPD in her paternal grandmother; Cancer in her cousin; Depression in her father, maternal grandfather, and mother; Diabetes in her maternal grandfather; Heart disease in her maternal grandfather; Hyperlipidemia in her father; Hypertension in her maternal grandfather, maternal grandmother, mother, and paternal grandmother. No Known Allergies   Review of Systems  Constitutional: Negative for fever and chills.  HENT: Positive for ear pain. Negative for ear discharge.   Neurological: Negative for headaches.       Objective:   Physical Exam  Constitutional: She appears well-developed and well-nourished.  HENT:  Mouth/Throat: Oropharynx is clear  and moist.  Left eardrum appears normal. Right canal is clear. There is no cerumen. Question small serous effusion. She has slightly amber colored fluid behind the eardrum. Landmarks are preserved.          Assessment & Plan:  Persistent right ear pain. Question serous effusion. We explained effusions can sometimes take several months to resolve. She is requesting ENT referral and we'll set up. We've not recommended additional medications at this time. She does not have any supperative changes at this time

## 2013-09-08 NOTE — Patient Instructions (Signed)
Serous Otitis Media  Serous otitis media is fluid in the middle ear space. This space contains the bones for hearing and air. Air in the middle ear space helps to transmit sound.  The air gets there through the eustachian tube. This tube goes from the back of the nose (nasopharynx) to the middle ear space. It keeps the pressure in the middle ear the same as the outside world. It also helps to drain fluid from the middle ear space. CAUSES  Serous otitis media occurs when the eustachian tube gets blocked. Blockage can come from:  Ear infections.  Colds and other upper respiratory infections.  Allergies.  Irritants such as cigarette smoke.  Sudden changes in air pressure (such as descending in an airplane).  Enlarged adenoids.  A mass in the nasopharynx. During colds and upper respiratory infections, the middle ear space can become temporarily filled with fluid. This can happen after an ear infection also. Once the infection clears, the fluid will generally drain out of the ear through the eustachian tube. If it does not, then serous otitis media occurs. SIGNS AND SYMPTOMS   Hearing loss.  A feeling of fullness in the ear, without pain.  Young children may not show any symptoms but may show slight behavioral changes, such as agitation, ear pulling, or crying. DIAGNOSIS  Serous otitis media is diagnosed by an ear exam. Tests may be done to check on the movement of the eardrum. Hearing exams may also be done. TREATMENT  The fluid most often goes away without treatment. If allergy is the cause, allergy treatment may be helpful. Fluid that persists for several months may require minor surgery. A small tube is placed in the eardrum to:  Drain the fluid.  Restore the air in the middle ear space. In certain situations, antibiotics are used to avoid surgery. Surgery may be done to remove enlarged adenoids (if this is the cause). HOME CARE INSTRUCTIONS   Keep children away from tobacco  smoke.  Be sure to keep any follow-up appointments. SEEK MEDICAL CARE IF:   Your hearing is not better in 3 months.  Your hearing is worse.  You have ear pain.  You have drainage from the ear.  You have dizziness.  You have serous otitis media only in one ear or have any bleeding from your nose (epistaxis).  You notice a lump on your neck. MAKE SURE YOU:  Understand these instructions.   Will watch your condition.   Will get help right away if you are not doing well or get worse.  Document Released: 10/07/2003 Document Revised: 03/19/2013 Document Reviewed: 02/11/2013 [employer] Patient Information 2014 [employer].  

## 2013-11-19 ENCOUNTER — Ambulatory Visit (INDEPENDENT_AMBULATORY_CARE_PROVIDER_SITE_OTHER): Payer: BC Managed Care – PPO | Admitting: Family

## 2013-11-19 ENCOUNTER — Encounter: Payer: Self-pay | Admitting: Family

## 2013-11-19 VITALS — BP 102/80 | HR 90 | Temp 99.0°F | Wt 148.0 lb

## 2013-11-19 DIAGNOSIS — J040 Acute laryngitis: Secondary | ICD-10-CM

## 2013-11-19 DIAGNOSIS — J309 Allergic rhinitis, unspecified: Secondary | ICD-10-CM

## 2013-11-19 MED ORDER — FEXOFENADINE-PSEUDOEPHED ER 180-240 MG PO TB24: 1.0000 | ORAL_TABLET | Freq: Every day | ORAL | Status: DC

## 2013-11-19 NOTE — Progress Notes (Signed)
Pre visit review using our clinic review tool, if applicable. No additional management support is needed unless otherwise documented below in the visit note. 

## 2013-11-19 NOTE — Progress Notes (Signed)
Subjective:    Patient ID: Angie Sanchez, female    DOB: 12/05/1985, 28 y.o.   MRN: [id number]  HPI 28 year old white female, nonsmoker than today with complaints of sore throat, cough, congestion x5 days. She has not taken any medication over-the-counter. Denies any fever or chills. Has a history of allergic rhinitis.   Review of Systems  Constitutional: Negative.   HENT: Positive for congestion and sore throat.   Respiratory: Negative.   Cardiovascular: Negative.   Endocrine: Negative.   Genitourinary: Negative.   Musculoskeletal: Negative.   Skin: Negative.   Neurological: Negative.   Psychiatric/Behavioral: Negative.    Past Medical History  Diagnosis Date  . Urinary tract infection   . H/O varicella     History   Social History  . Marital Status: Married    Spouse Name: N/A    Number of Children: N/A  . Years of Education: N/A   Occupational History  . Not on file.   Social History Main Topics  . Smoking status: Never Smoker   . Smokeless tobacco: Never Used  . Alcohol Use: No     Comment: rarely  . Drug Use: No  . Sexual Activity: Yes   Other Topics Concern  . Not on file   Social History Narrative  . No narrative on file    Past Surgical History  Procedure Laterality Date  . Wisdom tooth extraction    . Other surgical history  2001-2002    foot, knee, and finger (granulomas)  . Nodule removal      from knee fingers and toes    Family History  Problem Relation Age of Onset  . Hypertension Mother   . Depression Mother   . Hyperlipidemia Father   . Depression Father   . Arthritis Maternal Grandmother   . Hypertension Maternal Grandmother   . Arthritis Maternal Grandfather   . Diabetes Maternal Grandfather   . Heart disease Maternal Grandfather   . Hypertension Maternal Grandfather   . Depression Maternal Grandfather   . Cancer Cousin     breast  . Hypertension Paternal Grandmother   . COPD Paternal Grandmother     No Known  Allergies  Current Outpatient Prescriptions on File Prior to Visit  Medication Sig Dispense Refill  . ibuprofen (ADVIL,MOTRIN) 200 MG tablet Take 200 mg by mouth every 6 (six) hours as needed for pain.       No current facility-administered medications on file prior to visit.    BP 102/80  Pulse 90  Temp(Src) 99 F (37.2 C) (Oral)  Wt 148 lb (67.132 kg)  SpO2 95%chart    Objective:   Physical Exam  Constitutional: She is oriented to person, place, and time. She appears well-developed and well-nourished.  HENT:  Right Ear: External ear normal.  Left Ear: External ear normal.  Nose: Nose normal.  Pharynx moderately red  Neck: Normal range of motion. Neck supple.  Cardiovascular: Normal rate and normal heart sounds.   Pulmonary/Chest: Effort normal and breath sounds normal.  Musculoskeletal: Normal range of motion.  Neurological: She is alert and oriented to person, place, and time.  Skin: Skin is warm and dry.  Psychiatric: She has a normal mood and affect.          Assessment & Plan:  Vernona RiegerLaura was seen today for sore throat and laryngitis.  Diagnoses and associated orders for this visit:  Laryngitis  Allergic rhinitis  Other Orders - fexofenadine-pseudoephedrine (ALLEGRA-D 24) 180-240 MG per 24 hr  tablet; Take 1 tablet by mouth daily.    call the office with any questions or concerns. Recheck as scheduled and as needed.

## 2013-11-19 NOTE — Patient Instructions (Signed)
Laryngitis At the top of your windpipe is your voice box. It is the source of your voice. Inside your voice box are 2 bands of muscles called vocal cords. When you breathe, your vocal cords are relaxed and open so that air can get into the lungs. When you decide to say something, these cords come together and vibrate. The sound from these vibrations goes into your throat and comes out through your mouth as sound. Laryngitis is an inflammation of the vocal cords that causes hoarseness, cough, loss of voice, sore throat, and dry throat. Laryngitis can be temporary (acute) or long-term (chronic). Most cases of acute laryngitis improve with time.Chronic laryngitis lasts for more than 3 weeks. CAUSES Laryngitis can often be related to excessive smoking, talking, or yelling, as well as inhalation of toxic fumes and allergies. Acute laryngitis is usually caused by a viral infection, vocal strain, measles or mumps, or bacterial infections. Chronic laryngitis is usually caused by vocal cord strain, vocal cord injury, postnasal drip, growths on the vocal cords, or acid reflux. SYMPTOMS   Cough.  Sore throat.  Dry throat. RISK FACTORS  Respiratory infections.  Exposure to irritating substances, such as cigarette smoke, excessive amounts of alcohol, stomach acids, and workplace chemicals.  Voice trauma, such as vocal cord injury from shouting or speaking too loud. DIAGNOSIS  Your cargiver will perform a physical exam. During the physical exam, your caregiver will examine your throat. The most common sign of laryngitis is hoarseness. Laryngoscopy may be necessary to confirm the diagnosis of this condition. This procedure allows your caregiver to look into the larynx. HOME CARE INSTRUCTIONS  Drink enough fluids to keep your urine clear or pale yellow.  Rest until you no longer have symptoms or as directed by your caregiver.  Breathe in moist air.  Take all medicine as directed by your  caregiver.  Do not smoke.  Talk as little as possible (this includes whispering).  Write on paper instead of talking until your voice is back to normal.  Follow up with your caregiver if your condition has not improved after 10 days. SEEK MEDICAL CARE IF:   You have trouble breathing.  You cough up blood.  You have persistent fever.  You have increasing pain.  You have difficulty swallowing. MAKE SURE YOU:  Understand these instructions.  Will watch your condition.  Will get help right away if you are not doing well or get worse. Document Released: 07/17/2005 Document Revised: 10/09/2011 Document Reviewed: 09/22/2010 Exit[employer] Patient Information 2014 [employer].  

## 2014-01-12 ENCOUNTER — Telehealth: Payer: Self-pay | Admitting: Family

## 2014-01-12 DIAGNOSIS — H9319 Tinnitus, unspecified ear: Secondary | ICD-10-CM

## 2014-01-12 NOTE — Telephone Encounter (Signed)
Please advise 

## 2014-01-12 NOTE — Telephone Encounter (Signed)
Per Dr Dorma Russellkraus office [facility] [place] states they have spoken to the patient this morning and told her that they can not treat her for  The dizziness and tinnitus she will have to see a neurologist first . Appointment will not be made for the patient she would have to see a neurologist , and Dr Lovey NewcomerKrause office spoke with patient mother and informed her of this as well , when they called their office this morning

## 2014-01-12 NOTE — Addendum Note (Signed)
Addended byAdline Mango: CAMPBELL, PADONDA B on: 01/12/2014 11:24 AM   Modules accepted: Orders

## 2014-01-12 NOTE — Telephone Encounter (Signed)
Referral done

## 2014-01-12 NOTE — Telephone Encounter (Signed)
Pt is requesting a referral to the [facility] [place], dr Lovey NewcomerKrause,  Pt states she has ongoing ear issues, has been seen several times for her constant ear ringing . This has been going on 5 mos. pls advise Pt is requesting an appt w/ ear center w./in next week

## 2014-01-28 ENCOUNTER — Encounter: Payer: Self-pay | Admitting: Neurology

## 2014-01-28 ENCOUNTER — Ambulatory Visit (INDEPENDENT_AMBULATORY_CARE_PROVIDER_SITE_OTHER): Payer: BC Managed Care – PPO | Admitting: Neurology

## 2014-01-28 VITALS — BP 110/60 | HR 68 | Temp 99.2°F | Resp 18 | Ht 63.0 in | Wt 153.2 lb

## 2014-01-28 DIAGNOSIS — H9319 Tinnitus, unspecified ear: Secondary | ICD-10-CM

## 2014-01-28 DIAGNOSIS — R51 Headache: Secondary | ICD-10-CM

## 2014-01-28 DIAGNOSIS — R519 Headache, unspecified: Secondary | ICD-10-CM

## 2014-01-28 DIAGNOSIS — H9313 Tinnitus, bilateral: Secondary | ICD-10-CM

## 2014-01-28 NOTE — Patient Instructions (Addendum)
Since it occurred following ear infections, I would assume it is related to that.  If it is neurological, the nerve in the ears may have been damaged by the infection.  I doubt it is related to a problem in the brain itself. We will get MRI of the brain and internal auditory canals with and without contrast.  [facility] 02/04/14 4:45pm  I will contact you with results.  If there is anything suspicious that requires further testing or evaluation, we will set that up.

## 2014-01-28 NOTE — Progress Notes (Signed)
NEUROLOGY CONSULTATION NOTE  Angie MeigsLaura E Sanchez MRN: [id number] DOB: 09/11/1985  Referring provider: Adline MangoPadonda Campbell, FNP Primary care provider: Adline MangoPadonda Campbell, FNP  Reason for consult:  tinnitus  HISTORY OF PRESENT ILLNESS: Angie HackerLaura Sanchez is a 28 year old right-handed woman who presents for tinnitus.  Records were reviewed.  This past winter, she had frequent ear infections.  In January, she developed right ear pain.  She noted right aural fullness.  She described an occasional ringing and popping sensation.  Initially, there was some associated nasal congestion.  She had a little fever.  She denied hearing loss or ear drainage.  She was treated at that time with amoxicillin.  The ringing and popping sound continued.  She developed tenderness around her right ear.  She then developed popping sound in her left ear, but no pain or discomfort like the right ear.  She continued to have nasal congestion.  She was prescribed a Medrol dose pack to decrease inflammation of her Eustachian tube.  The symptoms persisted, although nasal congestion resolved.  Follow-up examination by her primary care provider suggested possible small serous effusion, described as "slightly amber colored fluid behind the eardrum".  Following treatment for the otitis media, she developed persistent high-pitched ringing in both ears, as well as a "hissing" sound in the right ear.  The ringing is worse at night.  Her right ear still feels stuffy. She denies hearing loss and facial numbness.  She denies vertigo but sometimes briefly feels unsteady on her feet. She was referred to ENT, and she reportedly had a normal workup.  No new medications.  She has a history of headache as well, which is separate from the tinnitus.  She began having headaches occasionally over the past few years.  It is located on the mid-suboccipital region and radiates to the top of the head.  It is a 6/10 throbbing sensation.  Maybe on rare occasions,  it may be associated with nausea, but she usually has no associated symptoms.  It is easily treated with Advil and resolves within 30 minutes.  It occurs 2-3 times a month.  There has been no change in quality or frequency of these headaches over the past few months.  The only thing different is that she occasionally has a tingling sensation in the same distribution of the posterior headache.  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  She suddenly developed visual disturbance, described as zig-zag lines in both visual fields.  It lasted about 10 minutes and resolved without any headache.  Occasionally, she will note brief flashing lights.  She was recently given a mild prescription for reading glasses but no recent significant visual impairment.  PAST MEDICAL HISTORY: Past Medical History  Diagnosis Date  . Urinary tract infection   . H/O varicella   . Headache(784.0)     PAST SURGICAL HISTORY: Past Surgical History  Procedure Laterality Date  . Wisdom tooth extraction    . Other surgical history  2001-2002    foot, knee, and finger (granulomas)  . Nodule removal      from knee fingers and toes    MEDICATIONS: Current Outpatient Prescriptions on File Prior to Visit  Medication Sig Dispense Refill  . ibuprofen (ADVIL,MOTRIN) 200 MG tablet Take 200 mg by mouth every 6 (six) hours as needed for pain.      . fexofenadine-pseudoephedrine (ALLEGRA-D 24) 180-240 MG per 24 hr tablet Take 1 tablet by mouth daily.  30 tablet  2   No current facility-administered medications on file prior to visit.    ALLERGIES: No Known Allergies  FAMILY HISTORY: Family History  Problem Relation Age of Onset  . Hypertension Mother   . Depression Mother   . Hyperlipidemia Father   . Depression Father   . Arthritis Maternal Grandmother   . Hypertension Maternal Grandmother   . Arthritis Maternal Grandfather   . Diabetes Maternal Grandfather   . Heart disease Maternal  Grandfather   . Hypertension Maternal Grandfather   . Depression Maternal Grandfather   . Cancer Cousin     breast  . Hypertension Paternal Grandmother   . COPD Paternal Grandmother     SOCIAL HISTORY: History   Social History  . Marital Status: Married    Spouse Name: N/A    Number of Children: N/A  . Years of Education: N/A   Occupational History  . Not on file.   Social History Main Topics  . Smoking status: Never Smoker   . Smokeless tobacco: Never Used  . Alcohol Use: No     Comment: rarely  . Drug Use: No  . Sexual Activity: Yes    Partners: Male   Other Topics Concern  . Not on file   Social History Narrative  . No narrative on file    REVIEW OF SYSTEMS: Constitutional: No fevers, chills, or sweats, no generalized fatigue, change in appetite Eyes: No visual changes, double vision, eye pain Ear, nose and throat: No hearing loss, ear pain, nasal congestion, sore throat Cardiovascular: No chest pain, palpitations Respiratory:  No shortness of breath at rest or with exertion, wheezes GastrointestinaI: No nausea, vomiting, diarrhea, abdominal pain, fecal incontinence Genitourinary:  No dysuria, urinary retention or frequency Musculoskeletal:  No neck pain, back pain Integumentary: No rash, pruritus, skin lesions Neurological: as above Psychiatric: No depression, insomnia, anxiety Endocrine: No palpitations, fatigue, diaphoresis, mood swings, change in appetite, change in weight, increased thirst Hematologic/Lymphatic:  No anemia, purpura, petechiae. Allergic/Immunologic: no itchy/runny eyes, nasal congestion, recent allergic reactions, rashes  PHYSICAL EXAM: Filed Vitals:   01/28/14 1053  BP: 110/60  Pulse: 68  Temp: 99.2 F (37.3 C)  Resp: 18   General: No acute distress Head:  Normocephalic/atraumatic, internal auditory canals and TMs unremarkable. Neck: supple, no paraspinal tenderness, full range of motion Back: No paraspinal tenderness Heart:  regular rate and rhythm Lungs: Clear to auscultation bilaterally. Vascular: No carotid bruits. Neurological Exam: Mental status: alert and oriented to person, place, and time, recent and remote memory intact, fund of knowledge intact, attention and concentration intact, speech fluent and not dysarthric, language intact. Cranial nerves: CN I: not tested CN II: pupils equal, round and reactive to light, visual fields intact, fundi unremarkable, without vessel changes, exudates, hemorrhages or papilledema. CN III, IV, VI:  full range of motion, no nystagmus, no ptosis CN V: facial sensation intact CN VII: upper and lower face symmetric CN VIII: hearing intact CN IX, X: gag intact, uvula midline CN XI: sternocleidomastoid and trapezius muscles intact CN XII: tongue midline Bulk & Tone: normal, no fasciculations. Motor: 5 out of 5 throughout Sensation: Temperature and vibration intact Deep Tendon Reflexes: 2+ throughout, toes downgoing Finger to nose testing: No dysmetria Heel to shin: No dysmetria Gait: Normal station and stride. Able to turn and walk in tandem. Romberg negative.  IMPRESSION: 1.  Tinnitus, following otitis media.  History would suggest an inner ear problem or possible injury of the auditory nerve due to infection .  I  do not have the notes from ENT, however.   2.  Episodic headache.  This is a chronic issue and unchanged.  It is not related to the above.  PLAN: Would get MRI of the brain and internal auditory canals with and without contrast.  If study is indicative of an intracranial abnormality, further management will be pursued.  Otherwise, it would be supportive care for tinnitus (such as white noise machine or cognitive behavioral therapy).  45 minutes spent with the patient, over 50% spent counseling and coordinating care.  Thank you for allowing me to take part in the care of this patient.  Shon MilletAdam Jaffe, DO  CC:  Adline MangoPadonda Campbell, FNP

## 2014-02-04 ENCOUNTER — Ambulatory Visit (HOSPITAL_COMMUNITY): Payer: BC Managed Care – PPO

## 2014-06-01 ENCOUNTER — Encounter: Payer: Self-pay | Admitting: Neurology

## 2014-06-12 ENCOUNTER — Telehealth: Payer: Self-pay | Admitting: Family

## 2014-06-12 DIAGNOSIS — J309 Allergic rhinitis, unspecified: Secondary | ICD-10-CM

## 2014-06-12 NOTE — Telephone Encounter (Signed)
[facility] and they did not feel her problems were related to her ears. She would like a referral to Dr Irena CordsVan Winkle. Has been taking Claritin daily. Can't go a day w/o taking and thinks it is time she sees a Proofreaderallergist.

## 2014-06-16 LAB — OB RESULTS CONSOLE HEPATITIS B SURFACE ANTIGEN: Hepatitis B Surface Ag: NEGATIVE

## 2014-06-16 LAB — OB RESULTS CONSOLE GC/CHLAMYDIA
Chlamydia: NEGATIVE
Gonorrhea: NEGATIVE

## 2014-06-16 LAB — OB RESULTS CONSOLE HIV ANTIBODY (ROUTINE TESTING): HIV: NONREACTIVE

## 2014-06-16 LAB — OB RESULTS CONSOLE ABO/RH: RH Type: NEGATIVE

## 2014-06-16 LAB — OB RESULTS CONSOLE RUBELLA ANTIBODY, IGM: Rubella: IMMUNE

## 2014-06-16 LAB — OB RESULTS CONSOLE RPR: RPR: NONREACTIVE

## 2014-06-16 LAB — OB RESULTS CONSOLE ANTIBODY SCREEN: ANTIBODY SCREEN: NEGATIVE

## 2014-06-16 NOTE — Telephone Encounter (Signed)
Referral placed.

## 2014-07-21 ENCOUNTER — Ambulatory Visit (INDEPENDENT_AMBULATORY_CARE_PROVIDER_SITE_OTHER): Payer: BC Managed Care – PPO | Admitting: Family Medicine

## 2014-07-21 ENCOUNTER — Encounter: Payer: Self-pay | Admitting: Family Medicine

## 2014-07-21 VITALS — BP 116/80 | HR 83 | Temp 98.4°F | Ht 63.0 in | Wt 168.1 lb

## 2014-07-21 DIAGNOSIS — K149 Disease of tongue, unspecified: Secondary | ICD-10-CM

## 2014-07-21 NOTE — Progress Notes (Signed)
Pre visit review using our clinic review tool, if applicable. No additional management support is needed unless otherwise documented below in the visit note. 

## 2014-07-21 NOTE — Progress Notes (Signed)
HPI:  Angie Sanchez is a 28 yo patient of Dr. Orvan Falconerampbell here for an acute visit for:  Tongue discomfort: -[redacted] weeks pregnant -report tongue feels strange to her for 4 days - feels a little swollen, but no swelling- she can't really describe it -reports always gets weird symptoms with pregnancy -denies: swelling, burning, weakness, trouble swallowing, weakness in face otherwise, fevers, oral pain, oral swelling, throat swelling, sob, tongue pain or rash, sore throat -no new medications  ROS: See pertinent positives and negatives per HPI.  Past Medical History  Diagnosis Date  . Urinary tract infection   . H/O varicella   . ZOXWRUEA(540.9Headache(784.0)     Past Surgical History  Procedure Laterality Date  . Wisdom tooth extraction    . Other surgical history  2001-2002    foot, knee, and finger (granulomas)  . Nodule removal      from knee fingers and toes    Family History  Problem Relation Age of Onset  . Hypertension Mother   . Depression Mother   . Hyperlipidemia Father   . Depression Father   . Arthritis Maternal Grandmother   . Hypertension Maternal Grandmother   . Arthritis Maternal Grandfather   . Diabetes Maternal Grandfather   . Heart disease Maternal Grandfather   . Hypertension Maternal Grandfather   . Depression Maternal Grandfather   . Cancer Cousin     breast  . Hypertension Paternal Grandmother   . COPD Paternal Grandmother     History   Social History  . Marital Status: Married    Spouse Name: N/A    Number of Children: N/A  . Years of Education: N/A   Social History Main Topics  . Smoking status: Never Smoker   . Smokeless tobacco: Never Used  . Alcohol Use: No     Comment: rarely  . Drug Use: No  . Sexual Activity:    Partners: Male   Other Topics Concern  . None   Social History Narrative    Current outpatient prescriptions: acetaminophen (TYLENOL) 500 MG tablet, Take 500 mg by mouth as needed., Disp: , Rfl: ;  loratadine (CLARITIN) 10  MG tablet, Take 10 mg by mouth daily., Disp: , Rfl: ;  Prenatal Multivit-Min-Fe-FA (PRENATAL VITAMINS PO), Take by mouth daily., Disp: , Rfl:   EXAM:  Filed Vitals:   07/21/14 1054  BP: 116/80  Pulse: 83  Temp: 98.4 F (36.9 C)    Body mass index is 29.79 kg/(m^2).  GENERAL: vitals reviewed and listed above, alert, oriented, appears well hydrated and in no acute distress  HEENT: atraumatic, conjunttiva clear, PERRLA, no obvious, normal inspection of rash abnormalities on inspection of external nose and ears, normal inspection of face, no swelling, redness, rash or abnormality on inspection of mouth and nose except for PND.  NECK: no obvious masses on inspection  LUNGS: clear to auscultation bilaterally, no wheezes, rales or rhonchi, good air movement  CV: HRRR, no peripheral edema  MS: moves all extremities without noticeable abnormality  PSYCH: pleasant and cooperative, no obvious depression or anxiety  NEURO: CN II-XII grosly in tact, finger to nose normal  ASSESSMENT AND PLAN:  Discussed the following assessment and plan:  Tongue disorder  -completely normal exam today, no swelling, no finding on exam -advise not sure of cause, given no other symptoms or findings she opted to monitor and will follow up with PCP for repeat exam or if other symptoms develop -Patient advised to return or notify a doctor immediately  if symptoms worsen or persist or new concerns arise.  Patient Instructions  Follow up in 2 weeks with your provider to recheck if persist or as needed     KIM, [facility] R.

## 2014-07-21 NOTE — Patient Instructions (Signed)
Follow up in 2 weeks with your provider to recheck if persist or as needed

## 2014-07-31 NOTE — L&D Delivery Note (Addendum)
Delivery Note At 5:24 AM a viable female, "Angie Sanchez", was delivered via Vaginal, Spontaneous Delivery (Presentation: ; Occiput Anterior).  APGAR: 7, 9; weight  .   Placenta status: Intact, Spontaneous.  Cord: 3 vessels with the following complications: None.  Cord pH: NA  Patient began pushing at 4:10a, with baby likely posterior, then rotated to OA.  Anesthesia: Epidural  Episiotomy: None Lacerations: None Suture Repair: None Est. Blood Loss (mL): 200  Mom to postpartum.  Baby to Couplet care / Skin to Skin. Family does not desire circumcision for baby Wilber Oliphant. Patient plans condoms for birth control.  Nigel Bridgeman 01/08/2015, 5:55 AM

## 2014-09-16 ENCOUNTER — Encounter (HOSPITAL_COMMUNITY): Payer: Self-pay | Admitting: *Deleted

## 2014-09-16 ENCOUNTER — Inpatient Hospital Stay (HOSPITAL_COMMUNITY)
Admission: AD | Admit: 2014-09-16 | Discharge: 2014-09-16 | Disposition: A | Discharge status: Home / Self Care | Payer: Medicaid Other | Source: Ambulatory Visit | Attending: Obstetrics and Gynecology | Admitting: Obstetrics and Gynecology

## 2014-09-16 DIAGNOSIS — R0789 Other chest pain: Secondary | ICD-10-CM | POA: Diagnosis present

## 2014-09-16 DIAGNOSIS — O9989 Other specified diseases and conditions complicating pregnancy, childbirth and the puerperium: Secondary | ICD-10-CM | POA: Insufficient documentation

## 2014-09-16 DIAGNOSIS — Z3A24 24 weeks gestation of pregnancy: Secondary | ICD-10-CM | POA: Insufficient documentation

## 2014-09-16 DIAGNOSIS — R12 Heartburn: Secondary | ICD-10-CM | POA: Insufficient documentation

## 2014-09-16 DIAGNOSIS — O99612 Diseases of the digestive system complicating pregnancy, second trimester: Secondary | ICD-10-CM

## 2014-09-16 DIAGNOSIS — K219 Gastro-esophageal reflux disease without esophagitis: Secondary | ICD-10-CM

## 2014-09-16 LAB — URINE MICROSCOPIC-ADD ON

## 2014-09-16 LAB — URINALYSIS, ROUTINE W REFLEX MICROSCOPIC
BILIRUBIN URINE: NEGATIVE
Glucose, UA: NEGATIVE mg/dL
KETONES UR: NEGATIVE mg/dL
NITRITE: NEGATIVE
Protein, ur: NEGATIVE mg/dL
Specific Gravity, Urine: >1.03 — ABNORMAL HIGH (ref 1.005–1.030)
UROBILINOGEN UA: 0.2 mg/dL (ref 0.0–1.0)
pH: 6 (ref 5.0–8.0)

## 2014-09-16 NOTE — Discharge Instructions (Signed)
Chest Wall Pain °Chest wall pain is pain felt in or around the chest bones and muscles. It may take up to 6 weeks to get better. It may take longer if you are active. Chest wall pain can happen on its own. Other times, things like germs, injury, coughing, or exercise can cause the pain. °HOME CARE  °· Avoid activities that make you tired or cause pain. Try not to use your chest, belly (abdominal), or side muscles. Do not use heavy weights. °· Put ice on the sore area. °¨ Put ice in a plastic bag. °¨ Place a towel between your skin and the bag. °¨ Leave the ice on for 15-20 minutes for the first 2 days. °· Only take medicine as told by your doctor. °GET HELP RIGHT AWAY IF:  °· You have more pain or are very uncomfortable. °· You have a fever. °· Your chest pain gets worse. °· You have new problems. °· You feel sick to your stomach (nauseous) or throw up (vomit). °· You start to sweat or feel lightheaded. °· You have a cough with mucus (phlegm). °· You cough up blood. °MAKE SURE YOU:  °· Understand these instructions. °· Will watch your condition. °· Will get help right away if you are not doing well or get worse. °Document Released: 01/03/2008 Document Revised: 10/09/2011 Document Reviewed: 03/13/2011 °Exit[employer]® Patient Information ©2015 [employer]. This information is not intended to replace advice given to you by your health care provider. Make sure you discuss any questions you have with your health care provider. ° °

## 2014-09-16 NOTE — MAU Provider Note (Signed)
History     CSN: [id number]  Arrival date and time: 09/16/14 2058   First Provider Initiated Contact with Patient 09/16/14 2137      No chief complaint on file.  HPI  Angie Sanchez is a 29 y.o. G3P2002 at [id number] who presents to MAU today with left lower chest wall pain since 1330 today. She states a dull pain is also radiating to the left neck and shoulder. She states pain has been off and on and there is point tenderness to an area under the left breast about the size of a quarter. She also endorses indigestion and heartburn. She has not taken any medications. She denies contractions, vaginal bleeding, N/V/D or LOF. She denies complication with this pregnancy, but endorses a history of polyhydramnios with her last pregnancy. She reports good fetal movement.   OB History    Gravida Para Term Preterm AB TAB SAB Ectopic Multiple Living   3 2 2       2       Past Medical History  Diagnosis Date  . Urinary tract infection   . H/O varicella   . YQMVHQIO(962.9Headache(784.0)     Past Surgical History  Procedure Laterality Date  . Wisdom tooth extraction    . Other surgical history  2001-2002    foot, knee, and finger (granulomas)  . Nodule removal      from knee fingers and toes    Family History  Problem Relation Age of Onset  . Hypertension Mother   . Depression Mother   . Hyperlipidemia Father   . Depression Father   . Arthritis Maternal Grandmother   . Hypertension Maternal Grandmother   . Arthritis Maternal Grandfather   . Diabetes Maternal Grandfather   . Heart disease Maternal Grandfather   . Hypertension Maternal Grandfather   . Depression Maternal Grandfather   . Cancer Cousin     breast  . Hypertension Paternal Grandmother   . COPD Paternal Grandmother     History  Substance Use Topics  . Smoking status: Never Smoker   . Smokeless tobacco: Never Used  . Alcohol Use: No     Comment: rarely    Allergies: No Known Allergies  No prescriptions prior to  admission    Review of Systems  Constitutional: Negative for fever and malaise/fatigue.  Gastrointestinal: Positive for abdominal pain. Negative for nausea, vomiting, diarrhea and constipation.  Genitourinary: Negative for dysuria, urgency and frequency.       Neg - vaginal bleeding, discharge, LOF   Physical Exam   Blood pressure 107/55, pulse 81, temperature 98.3 F (36.8 C), temperature source Oral, resp. rate 18, height 5\' 2"  (1.575 m), weight 180 lb (81.647 kg), last menstrual period 12/27/2013, SpO2 100 %.  Physical Exam  Constitutional: She is oriented to person, place, and time. She appears well-developed and well-nourished. No distress.  HENT:  Head: Normocephalic.  Cardiovascular: Normal rate.   Respiratory: Effort normal. She exhibits tenderness (mild tenderness to palpation under the left breast).  GI: Soft. She exhibits no distension and no mass. There is no tenderness. There is no rebound and no guarding.  Neurological: She is alert and oriented to person, place, and time.  Skin: Skin is warm and dry. No erythema.  Psychiatric: She has a normal mood and affect.    Results for orders placed or performed during the hospital encounter of 09/16/14 (from the past 24 hour(s))  Urinalysis, Routine w reflex microscopic     Status: Abnormal  Collection Time: 09/16/14  9:15 PM  Result Value Ref Range   Color, Urine YELLOW YELLOW   APPearance CLEAR CLEAR   Specific Gravity, Urine >1.030 (H) 1.005 - 1.030   pH 6.0 5.0 - 8.0   Glucose, UA NEGATIVE NEGATIVE mg/dL   Hgb urine dipstick TRACE (A) NEGATIVE   Bilirubin Urine NEGATIVE NEGATIVE   Ketones, ur NEGATIVE NEGATIVE mg/dL   Protein, ur NEGATIVE NEGATIVE mg/dL   Urobilinogen, UA 0.2 0.0 - 1.0 mg/dL   Nitrite NEGATIVE NEGATIVE   Leukocytes, UA SMALL (A) NEGATIVE  Urine microscopic-add on     Status: Abnormal   Collection Time: 09/16/14  9:15 PM  Result Value Ref Range   Squamous Epithelial / LPF FEW (A) RARE   WBC, UA  7-10 <3 WBC/hpf   RBC / HPF 3-6 <3 RBC/hpf   Bacteria, UA FEW (A) RARE    Fetal Monitoring: Baseline: 140 bpm, moderate variability, + accelerations, no decelerations Contractions: none  MAU Course  Procedures None  MDM EKG - normal sinus rhythm  Assessment and Plan  A: SIUP at [redacted]w[redacted]d Heartburn in pregnancy Chest wall pain  P: Discharge home Advised to take Tylenol PRN pain Recommend TUMs or Pepcid for reflux and indigestion Patient encouraged to follow-up with Angie Sanchez as scheduled for routine prenatal care Patient may return to MAU as needed or if her condition were to change or worsen   Marny Lowenstein, PA-C  09/16/2014, 11:42 PM

## 2014-09-16 NOTE — MAU Note (Signed)
Pt states from 1:30 today she has had worsening pain under left breast, later today pain moved up into her neck and left shoulder. Denies shortness of breath.

## 2014-11-02 LAB — OB RESULTS CONSOLE GBS: GBS: POSITIVE

## 2014-12-23 ENCOUNTER — Telehealth: Payer: Self-pay | Admitting: Family

## 2014-12-23 NOTE — Telephone Encounter (Signed)
[facility] forwarded to Dr. Adline MangoPadonda Campbell via fax 12/23/14 fbg.

## 2014-12-29 ENCOUNTER — Telehealth: Payer: Self-pay | Admitting: Family

## 2014-12-29 NOTE — Telephone Encounter (Signed)
[facility] forwarded to Dr. Adline MangoPadonda Campbell via fax 12/29/14 fbg.

## 2014-12-31 ENCOUNTER — Telehealth: Payer: Self-pay | Admitting: Family

## 2014-12-31 NOTE — Telephone Encounter (Signed)
[facility]  forwarded to Dr. Adline MangoPadonda Campbell via fax 12/31/14 fbg.

## 2015-01-02 ENCOUNTER — Inpatient Hospital Stay (HOSPITAL_COMMUNITY)
Admission: AD | Admit: 2015-01-02 | Payer: Medicaid Other | Source: Ambulatory Visit | Admitting: Obstetrics and Gynecology

## 2015-01-05 ENCOUNTER — Telehealth: Payer: Self-pay | Admitting: Family

## 2015-01-05 NOTE — Telephone Encounter (Signed)
Received records from Dr. Abby PotashAnglea Roberts/[facility] forwarded via fax to Dr. Adline MangoPadonda Campbell 01/05/15 fbg.

## 2015-01-06 ENCOUNTER — Encounter (HOSPITAL_COMMUNITY): Payer: Self-pay | Admitting: *Deleted

## 2015-01-06 ENCOUNTER — Telehealth (HOSPITAL_COMMUNITY): Payer: Self-pay | Admitting: *Deleted

## 2015-01-06 NOTE — Telephone Encounter (Signed)
Preadmission screen  

## 2015-01-07 ENCOUNTER — Encounter (HOSPITAL_COMMUNITY): Payer: Self-pay

## 2015-01-07 ENCOUNTER — Inpatient Hospital Stay (HOSPITAL_COMMUNITY): Payer: Medicaid Other | Admitting: Anesthesiology

## 2015-01-07 ENCOUNTER — Inpatient Hospital Stay (HOSPITAL_COMMUNITY)
Admission: RE | Admit: 2015-01-07 | Discharge: 2015-01-10 | DRG: 775 | Disposition: A | Discharge status: Home / Self Care | Payer: Medicaid Other | Source: Ambulatory Visit | Attending: Obstetrics & Gynecology | Admitting: Obstetrics & Gynecology

## 2015-01-07 DIAGNOSIS — IMO0002 Reserved for concepts with insufficient information to code with codable children: Secondary | ICD-10-CM | POA: Diagnosis present

## 2015-01-07 DIAGNOSIS — Z3A40 40 weeks gestation of pregnancy: Secondary | ICD-10-CM | POA: Diagnosis present

## 2015-01-07 DIAGNOSIS — Z833 Family history of diabetes mellitus: Secondary | ICD-10-CM

## 2015-01-07 DIAGNOSIS — Z820 Family history of epilepsy and other diseases of the nervous system: Secondary | ICD-10-CM

## 2015-01-07 DIAGNOSIS — O26899 Other specified pregnancy related conditions, unspecified trimester: Secondary | ICD-10-CM

## 2015-01-07 DIAGNOSIS — O99824 Streptococcus B carrier state complicating childbirth: Secondary | ICD-10-CM | POA: Diagnosis present

## 2015-01-07 DIAGNOSIS — Z8249 Family history of ischemic heart disease and other diseases of the circulatory system: Secondary | ICD-10-CM

## 2015-01-07 DIAGNOSIS — O3663X0 Maternal care for excessive fetal growth, third trimester, not applicable or unspecified: Secondary | ICD-10-CM | POA: Diagnosis present

## 2015-01-07 DIAGNOSIS — O403XX0 Polyhydramnios, third trimester, not applicable or unspecified: Secondary | ICD-10-CM | POA: Diagnosis present

## 2015-01-07 DIAGNOSIS — Z6791 Unspecified blood type, Rh negative: Secondary | ICD-10-CM

## 2015-01-07 DIAGNOSIS — O26893 Other specified pregnancy related conditions, third trimester: Secondary | ICD-10-CM | POA: Diagnosis present

## 2015-01-07 DIAGNOSIS — B951 Streptococcus, group B, as the cause of diseases classified elsewhere: Secondary | ICD-10-CM | POA: Diagnosis present

## 2015-01-07 LAB — CBC
HEMATOCRIT: 34.8 % — AB (ref 36.0–46.0)
Hemoglobin: 11.7 g/dL — ABNORMAL LOW (ref 12.0–15.0)
MCH: 27.7 pg (ref 26.0–34.0)
MCHC: 33.6 g/dL (ref 30.0–36.0)
MCV: 82.5 fL (ref 78.0–100.0)
Platelets: 187 10*3/uL (ref 150–400)
RBC: 4.22 MIL/uL (ref 3.87–5.11)
RDW: 13.6 % (ref 11.5–15.5)
WBC: 8.2 10*3/uL (ref 4.0–10.5)

## 2015-01-07 LAB — TYPE AND SCREEN
ABO/RH(D): A NEG
Antibody Screen: NEGATIVE

## 2015-01-07 LAB — RPR: RPR Ser Ql: NONREACTIVE

## 2015-01-07 MED ORDER — OXYTOCIN 40 UNITS IN LACTATED RINGERS INFUSION - SIMPLE MED
62.5000 mL/h | INTRAVENOUS | Status: DC
  Filled 2015-01-07: qty 1000

## 2015-01-07 MED ORDER — ACETAMINOPHEN 325 MG PO TABS: 650.0000 mg | ORAL_TABLET | ORAL | Status: DC | PRN

## 2015-01-07 MED ORDER — PENICILLIN G POTASSIUM 5000000 UNITS IJ SOLR
5.0000 10*6.[IU] | Freq: Once | INTRAVENOUS | Status: AC
  Administered 2015-01-07: 5 10*6.[IU] via INTRAVENOUS
  Filled 2015-01-07: qty 5

## 2015-01-07 MED ORDER — FENTANYL CITRATE (PF) 100 MCG/2ML IJ SOLN
100.0000 ug | INTRAMUSCULAR | Status: DC | PRN
Start: 2015-01-07 — End: 2015-01-08
  Administered 2015-01-07 (×2): 100 ug via INTRAVENOUS
  Filled 2015-01-07 (×2): qty 2

## 2015-01-07 MED ORDER — PENICILLIN G POTASSIUM 5000000 UNITS IJ SOLR
2.5000 10*6.[IU] | INTRAMUSCULAR | Status: DC
  Administered 2015-01-07 (×2): 2.5 10*6.[IU] via INTRAVENOUS
  Filled 2015-01-07 (×5): qty 2.5

## 2015-01-07 MED ORDER — OXYTOCIN BOLUS FROM INFUSION
500.0000 mL | INTRAVENOUS | Status: DC
  Administered 2015-01-08: 500 mL via INTRAVENOUS

## 2015-01-07 MED ORDER — TERBUTALINE SULFATE 1 MG/ML IJ SOLN: 0.2500 mg | Freq: Once | INTRAMUSCULAR | Status: AC | PRN

## 2015-01-07 MED ORDER — FLEET ENEMA 7-19 GM/118ML RE ENEM: 1.0000 | ENEMA | RECTAL | Status: DC | PRN

## 2015-01-07 MED ORDER — ONDANSETRON HCL 4 MG/2ML IJ SOLN: 4.0000 mg | Freq: Four times a day (QID) | INTRAMUSCULAR | Status: DC | PRN

## 2015-01-07 MED ORDER — FENTANYL CITRATE (PF) 100 MCG/2ML IJ SOLN
100.0000 ug | Freq: Once | INTRAMUSCULAR | Status: AC
  Administered 2015-01-07: 100 ug via INTRAVENOUS
  Filled 2015-01-07: qty 2

## 2015-01-07 MED ORDER — LACTATED RINGERS IV SOLN: 500.0000 mL | INTRAVENOUS | Status: DC | PRN

## 2015-01-07 MED ORDER — OXYTOCIN 40 UNITS IN LACTATED RINGERS INFUSION - SIMPLE MED
1.0000 m[IU]/min | INTRAVENOUS | Status: DC
  Administered 2015-01-07: 1 m[IU]/min via INTRAVENOUS

## 2015-01-07 MED ORDER — FENTANYL 2.5 MCG/ML BUPIVACAINE 1/10 % EPIDURAL INFUSION (WH - ANES)
14.0000 mL/h | INTRAMUSCULAR | Status: DC | PRN
  Administered 2015-01-08: 14 mL/h via EPIDURAL
  Filled 2015-01-07 (×2): qty 125

## 2015-01-07 MED ORDER — CITRIC ACID-SODIUM CITRATE 334-500 MG/5ML PO SOLN: 30.0000 mL | ORAL | Status: DC | PRN

## 2015-01-07 MED ORDER — LIDOCAINE HCL (PF) 1 % IJ SOLN
30.0000 mL | INTRAMUSCULAR | Status: DC | PRN
  Filled 2015-01-07: qty 30

## 2015-01-07 MED ORDER — OXYCODONE-ACETAMINOPHEN 5-325 MG PO TABS: 1.0000 | ORAL_TABLET | ORAL | Status: DC | PRN

## 2015-01-07 MED ORDER — SODIUM CHLORIDE 0.9 % IV SOLN
2.0000 g | Freq: Four times a day (QID) | INTRAVENOUS | Status: DC
Start: 2015-01-07 — End: 2015-01-08
  Administered 2015-01-07 – 2015-01-08 (×2): 2 g via INTRAVENOUS
  Filled 2015-01-07 (×4): qty 2000

## 2015-01-07 MED ORDER — PHENYLEPHRINE 40 MCG/ML (10ML) SYRINGE FOR IV PUSH (FOR BLOOD PRESSURE SUPPORT)
80.0000 ug | PREFILLED_SYRINGE | INTRAVENOUS | Status: DC | PRN
  Filled 2015-01-07: qty 2
  Filled 2015-01-07: qty 20

## 2015-01-07 MED ORDER — OXYCODONE-ACETAMINOPHEN 5-325 MG PO TABS: 2.0000 | ORAL_TABLET | ORAL | Status: DC | PRN

## 2015-01-07 MED ORDER — EPHEDRINE 5 MG/ML INJ
10.0000 mg | INTRAVENOUS | Status: DC | PRN
  Filled 2015-01-07: qty 2

## 2015-01-07 MED ORDER — DIPHENHYDRAMINE HCL 50 MG/ML IJ SOLN: 12.5000 mg | INTRAMUSCULAR | Status: DC | PRN

## 2015-01-07 MED ORDER — LACTATED RINGERS IV SOLN: INTRAVENOUS | Status: DC

## 2015-01-07 MED ORDER — ZOLPIDEM TARTRATE 5 MG PO TABS: 5.0000 mg | ORAL_TABLET | Freq: Every evening | ORAL | Status: DC | PRN

## 2015-01-07 NOTE — H&P (Addendum)
Angie Sanchez is a 29 y.o. female, G3P2002 at 40 weeks 5 days, presenting for labor induction for polyhydramanios.  Patient Active Problem List   Diagnosis Date Noted  . Indication for care in labor or delivery 01/07/2015    History of present pregnancy: Patient entered care at [redacted] weeks EGA as transfer from other provider.    EDC of 12/26/14 was established by first trimester ultrasound.   Anatomy scan:  19 weeks 2 days, with normal findings and an anterior placenta.   Additional [place] evaluations:  For measuring size greater than dates, polyhydramnios follow up.     Significant prenatal events:  Unstable fetal lie (Breech, transverse presentation).  Polyhydramnios, Large for gestational age.  Last Ultrasound: 01/05/15: EFW 10 lbs 7 oz, Cephalic, AFI 32 cm.  Last evaluation:  Bedside ultrasound today shows fetal cephalic presentation.  Pelvis feels adequate, proven to 9lb 2oz before.   OB History    Gravida Para Term Preterm AB TAB SAB Ectopic Multiple Living   Past Medical History  Diagnosis Date  . Urinary tract infection   . H/O varicella   . Headache(784.0)   . Tinnitus     since 2015   Past Surgical History  Procedure Laterality Date  . Wisdom tooth extraction    . Other surgical history  2001-2002    foot, knee, and finger (granulomas)  . Nodule removal      from knee fingers and toes   Family History: family history includes Arthritis in her maternal grandfather and maternal grandmother; Cancer in her cousin; Depression in her father, maternal grandfather, and mother; Diabetes in her maternal grandfather; Heart disease in her maternal grandfather; Hyperlipidemia in her father; Hypertension in her maternal grandfather, maternal grandmother, mother, and paternal grandmother; Parkinson's disease in her paternal grandmother. Social History:  reports that she has never smoked. She has never used smokeless tobacco. She reports that she does not drink alcohol or  use illicit drugs.   Prenatal Transfer Tool  Maternal Diabetes: No Genetic Screening: Declined Maternal Ultrasounds/Referrals: Abnormal:  Findings:   Other: LGA, Polyhydramnios.  Fetal Ultrasounds or other Referrals:  None Maternal Substance Abuse:  No Significant Maternal Medications:  None Significant Maternal Lab Results: Lab values include: Rh negative  TDAP: None administered recently.  Flu: None administered recently.    ROS:   Constitutional: Denies fevers/chills.  54 lb weight gain in pregnancy.  Cardiovascular: Denies chest pain or shortness of breath or palpitations. Pulmonary: Denies coughing or wheezing Gastrointestinal: Denies nausea or vomiting Musculoskeletal: Denies muscle or joint pain.  + With lower back pain.  Genitourinary: Denies dysuria/urgency/frequency.  Obstetric: With contractions.  Denies vaginal bleeding or leakage of fluid. With gross fetal movement.     No Known Allergies   Dilation: 1 Exam by:: Adelin Ventrella Height  (1.575 m), weight 90.719 kg (200 lb), last menstrual period 12/27/2013. Exam: 0-1/50%/-3.  FOLEY BALLOON PLACED THROUGH CERVIX AT ABOUT 9AM, 60CC NS INSTILLED, PLACED UNDER TENSION WITH TAPE TO HER THIGH.  Chest clear Heart RRR without murmur Abd gravid, NT, FH 45 CM ON 6/1.   Ext: Warm and well perfused,  1+ edema bilaterally in lower extremities bilaterally.     FHR: Category 1 UCs:  Q 2- 4 mins  Prenatal labs: ABO, Rh: --/--/A NEG (06/09 0743) Antibody: NEG (06/09 0743) Rubella:   Immune RPR: Nonreactive (11/17 0000)  HBsAg: Negative (11/17 0000)  HIV: Non-reactive (  11/17 0000)  GBS: Positive (04/04 0000) Sickle cell/Hgb electrophoresis:  Normal Pap:  Negative GC:  Negative Chlamydia:  Negative Genetic screenings:  Declined Glucola:  Normal Other:   CBC    Component Value Date/Time   WBC 8.2 01/07/2015 0743   RBC 4.22 01/07/2015 0743   HGB 11.7* 01/07/2015 0743   HCT 34.8* 01/07/2015 0743   PLT 187 01/07/2015  0743   MCV 82.5 01/07/2015 0743   MCH 27.7 01/07/2015 0743   MCHC 33.6 01/07/2015 0743   RDW 13.6 01/07/2015 0743   LYMPHSABS 2.3 04/17/2013 1636   MONOABS 0.5 04/17/2013 1636   EOSABS 0.3 04/17/2013 1636   BASOSABS 0.0 04/17/2013 1636      Assessment/Plan: IUP at 40 W 5 D, induction of labor for polyhydramnios    Plan: Admit to [employer].  Routine [facility] orders PCN G for GBS prophylaxis  Patient desires a waterbirth delivery.  We discussed extensively risks, benefits and alternatives of water birth delivery,patient had signed consent form in office on 4/4 and also today she resigned them.  Given recent EFW of 10 lbs 7 oz and her history of her largest baby delivered before being 9 lbs 2 oz, we discussed challenges that may present with attempting a water birth with a large for gestational age fetus including risks of shoulder dystocia and challenges that may be encountered if she is in the tub. We discussed extensively risks of shoulder dystocia including risks of fetal and maternal compromise. I recommended and advised delivering in bed but patient declines and insists on water birth fully accepting the accompanying risks.     May perform intermittent monitoring but if ruptures membranes will need more extended monitoring because of risk of cord prolapse.  Patient declines cytotec or pitocin use but agrees to foley bulb and AROM for labor induction.    [facility] 01/07/2015, 12:14 PM

## 2015-01-07 NOTE — Anesthesia Preprocedure Evaluation (Addendum)
Anesthesia Evaluation  Patient identified by MRN, date of birth, ID band Patient awake    Reviewed: Allergy & Precautions, H&P , NPO status , Patient's Chart, lab work & pertinent test results  Airway Mallampati: II  TM Distance: >3 FB Neck ROM: full    Dental no notable dental hx. (+) Teeth Intact   Pulmonary neg pulmonary ROS,    Pulmonary exam normal       Cardiovascular negative cardio ROS Normal cardiovascular exam    Neuro/Psych negative psych ROS   GI/Hepatic negative GI ROS, Neg liver ROS,   Endo/Other  negative endocrine ROS  Renal/GU negative Renal ROS     Musculoskeletal   Abdominal (+) + obese,   Peds  Hematology negative hematology ROS (+)   Anesthesia Other Findings   Reproductive/Obstetrics negative OB ROS (+) Pregnancy                            Anesthesia Physical Anesthesia Plan  ASA: II  Anesthesia Plan: Epidural   Post-op Pain Management:    Induction:   Airway Management Planned:   Additional Equipment:   Intra-op Plan:   Post-operative Plan:   Informed Consent: I have reviewed the patients History and Physical, chart, labs and discussed the procedure including the risks, benefits and alternatives for the proposed anesthesia with the patient or authorized representative who has indicated his/her understanding and acceptance.     Plan Discussed with:   Anesthesia Plan Comments:         Anesthesia Quick Evaluation

## 2015-01-07 NOTE — Progress Notes (Signed)
  Subjective: Out of tub for recheck.  Husband at bedside.  Objective: Ht 5\' 2"  (1.575 m)  Wt 90.719 kg (200 lb)  BMI 36.57 kg/m2  LMP 12/27/2013      FHT: Reactive on recent tracing, no decels. UC:   q 3-4 min, moderate to palpation. SVE:   Posterior, 6 cm, 75%, vtx, -3 ballotable, IBOW.  Assessment:  Early labor GBS positive Rh negative LGA fetus Polyhydramnios  Plan: Advised patient to get out of tub and be upright for 1-2 hours, then I will recheck for potential AROM.  Nigel Bridgeman CNM 01/07/2015, 3:47 PM

## 2015-01-07 NOTE — Progress Notes (Signed)
  Subjective: Moaning with UCs--received Fentanyl at 1923 with some benefit.  In bed, on EFM.  Objective: BP 118/67 mmHg  Pulse 92  Temp(Src) 99.4 F (37.4 C) (Oral)  Ht 5\' 2"  (1.575 m)  Wt 90.719 kg (200 lb)  BMI 36.57 kg/m2  LMP 12/27/2013      FHT: Category 1 UC:   regular, every 4 minutes SVE:   Dilation: 7.5 Effacement (%): 80 Station: -2 Exam by:: vEmilee Hero, CNM  Slightly asynclitic, with more cervix on right than left. AROM at 6:50p.   Assessment:  Active labor RH negative GBS positive Suspected LGA Polyhydramnios  Plan: Continue current care Encouraged patient to change position to facilitate rotation/descent.  Nigel Bridgeman CNM 01/07/2015, 8:15 PM

## 2015-01-07 NOTE — Progress Notes (Signed)
  Subjective: Patient struggling to cope with UCs--Fentanyl at 2030 with little benefit.  Objective: BP 118/67 mmHg  Pulse 92  Temp(Src) 99.4 F (37.4 C) (Oral)  Ht 5\' 2"  (1.575 m)  Wt 90.719 kg (200 lb)  BMI 36.57 kg/m2  LMP 12/27/2013      FHT: Category 1 UC:   regular, every 4 minutes SVE:   Dilation: 7.5 Effacement (%): 80 Station: -2 Exam by:: Manfred Arch, CNM  Cervix unchanged from last exam, no descent. Minimal thrust with UCs.   Assessment:  Arrest of labor, likely due to inadequate contractions. Suspected LGA fetus Rh negative GBS positive  Plan: Recommend epidural and pitocin augmentation.  Reviewed rationale for these recommendations with patient and her support persons.  Patient does want to proceed with epidural, then pitocin. Dr. Willia Craze will plan to be present for delivery due to increased risk of shoulder dystocia.  Nigel Bridgeman CNM 01/07/2015, 9:28 PM

## 2015-01-07 NOTE — Progress Notes (Signed)
  Subjective: Received IV Fentanyl with some benefit at 1801.  Accepting of AROM now.  Objective: Temp(Src) 98.7 F (37.1 C)  Ht 5\' 2"  (1.575 m)  Wt 90.719 kg (200 lb)  BMI 36.57 kg/m2  LMP 12/27/2013    FHT: Category 1 on NST  UC:   regular, every 4 minutes SVE:   Dilation: 7 Effacement (%): 80 Station: -2 Exam by:: Trenice Mesa   AROM--copious clear fluid   Assessment:  Active labor GBS positive Rh Negative Suspected LGA infant Polyhydraminos  Plan: Continue to observe labor progress. Still OK for waterbirth tub use. Dr. Sallye Ober updated.  Angie Sanchez CNM 01/07/2015, 6:55 PM

## 2015-01-07 NOTE — Progress Notes (Addendum)
  Subjective: Currently in tub, coping well with UCs. Husband and friend at bedside. Declines VE at present.  Objective: Ht 5\' 2"  (1.575 m)  Wt 90.719 kg (200 lb)  BMI 36.57 kg/m2  LMP 12/27/2013    Received 2nd dose PCN at 1200.  FHT: Reassuring on intermittent auscultation UC:   q 2-3 min per patient report and my observation SVE:   Dilation: 6 Effacement (%): 60 Station: -3, Ballotable Exam by:: m wilkins rnc at 1320 by RN   Assessment:  IUP at 40 5/7 weeks Polyhydramnios--AFI 32 LGA fetus--EFW 10+7 on 01/05/15 GBS positive Rh negative   Plan: Continue current care OK per Dr. Sallye Ober for waterbirth tub use and intermittent monitoring. Plan recheck of cervix 2 hours from last exam, with AROM if vtx lower. Reviewed again with patient and support persons need to be evaluative regarding progression of 2nd stage due to suspected LGA infant.   Nigel Bridgeman CNM 01/07/2015, 2:09 PM

## 2015-01-07 NOTE — Progress Notes (Addendum)
   Subjective: Back in tub after being out for approx 1 hour.  Reports UCs strong, but coping well in tub.  C/o PCN antibiotic burning arm.  Objective: Ht 5\' 2"  (1.575 m)  Wt 90.719 kg (200 lb)  BMI 36.57 kg/m2  LMP 12/27/2013      FHT: Reassuring on intermittent auscultation UC:   regular, every 3-4 minutes SVE:   Declined by patient at present   Assessment:  Early labor GBS positive Rh negative. Suspected LGA fetus Polyhydramnios  Plan: Plan to check cervix by 5:30--will have Dr. Sallye Ober there in case needling membranes is necessary. Will d/c PCN and start Ampicillin 2 gm q 6 hr--next dose 2230.  Nigel Bridgeman CNM 01/07/2015, 5:06 PM

## 2015-01-08 ENCOUNTER — Encounter (HOSPITAL_COMMUNITY): Payer: Self-pay

## 2015-01-08 DIAGNOSIS — IMO0002 Reserved for concepts with insufficient information to code with codable children: Secondary | ICD-10-CM | POA: Diagnosis present

## 2015-01-08 DIAGNOSIS — O403XX0 Polyhydramnios, third trimester, not applicable or unspecified: Secondary | ICD-10-CM | POA: Diagnosis present

## 2015-01-08 DIAGNOSIS — B951 Streptococcus, group B, as the cause of diseases classified elsewhere: Secondary | ICD-10-CM | POA: Diagnosis present

## 2015-01-08 DIAGNOSIS — Z6791 Unspecified blood type, Rh negative: Secondary | ICD-10-CM | POA: Diagnosis present

## 2015-01-08 DIAGNOSIS — O26899 Other specified pregnancy related conditions, unspecified trimester: Secondary | ICD-10-CM

## 2015-01-08 MED ORDER — PRENATAL MULTIVITAMIN CH
1.0000 | ORAL_TABLET | Freq: Every day | ORAL | Status: DC
  Administered 2015-01-09 – 2015-01-10 (×2): 1 via ORAL
  Filled 2015-01-08 (×2): qty 1

## 2015-01-08 MED ORDER — DIBUCAINE 1 % RE OINT: 1.0000 "application " | TOPICAL_OINTMENT | RECTAL | Status: DC | PRN

## 2015-01-08 MED ORDER — LANOLIN HYDROUS EX OINT: TOPICAL_OINTMENT | CUTANEOUS | Status: DC | PRN

## 2015-01-08 MED ORDER — SENNOSIDES-DOCUSATE SODIUM 8.6-50 MG PO TABS
2.0000 | ORAL_TABLET | ORAL | Status: DC
  Administered 2015-01-09 (×2): 2 via ORAL
  Filled 2015-01-08 (×2): qty 2

## 2015-01-08 MED ORDER — TETANUS-DIPHTH-ACELL PERTUSSIS 5-2.5-18.5 LF-MCG/0.5 IM SUSP: 0.5000 mL | Freq: Once | INTRAMUSCULAR | Status: DC

## 2015-01-08 MED ORDER — IBUPROFEN 600 MG PO TABS
600.0000 mg | ORAL_TABLET | Freq: Four times a day (QID) | ORAL | Status: DC
  Administered 2015-01-08 – 2015-01-10 (×9): 600 mg via ORAL
  Filled 2015-01-08 (×9): qty 1

## 2015-01-08 MED ORDER — ZOLPIDEM TARTRATE 5 MG PO TABS: 5.0000 mg | ORAL_TABLET | Freq: Every evening | ORAL | Status: DC | PRN

## 2015-01-08 MED ORDER — OXYCODONE-ACETAMINOPHEN 5-325 MG PO TABS: 2.0000 | ORAL_TABLET | ORAL | Status: DC | PRN

## 2015-01-08 MED ORDER — ONDANSETRON HCL 4 MG/2ML IJ SOLN: 4.0000 mg | INTRAMUSCULAR | Status: DC | PRN

## 2015-01-08 MED ORDER — ACETAMINOPHEN 325 MG PO TABS: 650.0000 mg | ORAL_TABLET | ORAL | Status: DC | PRN

## 2015-01-08 MED ORDER — SIMETHICONE 80 MG PO CHEW: 80.0000 mg | CHEWABLE_TABLET | ORAL | Status: DC | PRN

## 2015-01-08 MED ORDER — DIPHENHYDRAMINE HCL 25 MG PO CAPS: 25.0000 mg | ORAL_CAPSULE | Freq: Four times a day (QID) | ORAL | Status: DC | PRN

## 2015-01-08 MED ORDER — WITCH HAZEL-GLYCERIN EX PADS: 1.0000 "application " | MEDICATED_PAD | CUTANEOUS | Status: DC | PRN

## 2015-01-08 MED ORDER — OXYCODONE-ACETAMINOPHEN 5-325 MG PO TABS: 1.0000 | ORAL_TABLET | ORAL | Status: DC | PRN

## 2015-01-08 MED ORDER — ONDANSETRON HCL 4 MG PO TABS
4.0000 mg | ORAL_TABLET | ORAL | Status: DC | PRN
Start: 2015-01-08 — End: 2015-01-10

## 2015-01-08 MED ORDER — BENZOCAINE-MENTHOL 20-0.5 % EX AERO
1.0000 "application " | INHALATION_SPRAY | CUTANEOUS | Status: DC | PRN
  Administered 2015-01-08: 1 via TOPICAL
  Filled 2015-01-08: qty 56

## 2015-01-08 NOTE — Progress Notes (Signed)
  Subjective: Still feeling pressure, but coping better s/p epidural.  Initial epidural had to be replaced due to non-functioning.  Objective: BP 97/42 mmHg  Pulse 151  Temp(Src) 97.9 F (36.6 C) (Oral)  Resp 18  Ht 5\' 2"  (1.575 m)  Wt 90.719 kg (200 lb)  BMI 36.57 kg/m2  LMP 12/27/2013      FHT: Category 1 UC:   regular, every 3-4 minutes SVE:   Dilation: 9 Effacement (%): 80 Station: -2 Exam by:: Manfred Arch, CNM  Vtx still high, but does descend with UCs. Still significantly asynclitic, not well-applied to cervix. IUPC inserted without difficulty Pitocin at 1 mu/min MVUs obviously inadequate  Assessment:  Transitional labor Asynclitic presentation Suspected LGA infant Rh negative GBS positive Inadequate labor.  Plan: Continue pitocin augmentation to establish/maintain adequacy. Continue position changes to facilitate rotation/descent. Support to patient for challenging labor situation.  Nigel Bridgeman CNM 01/08/2015, 12:06 AM

## 2015-01-08 NOTE — Lactation Note (Signed)
This note was copied from the chart of Angie Sanchez. Lactation Consultation Note Experienced BF mom BF her 1st child for 18 months, her 2nd child for 2 1/2 yrs. And stopped d/t mom was 20 weeks. Pregnant. Denies any problems. Mom has Large everted nipples. Mom expressed colostrum. Baby was 10 lbs. Stated baby has BF well several times.  Mom encouraged to feed baby 8-12 times/24 hours and with feeding cues. Mom encouraged to waken baby for feeds. Educated about newborn behavior. Referred to Baby and Me Book in Breastfeeding section Pg. 22-23 for position options and Proper latch demonstration. Mom encouraged to do skin-to-skin. WH/LC brochure given w/resources, support groups and LC services. Patient Name: Angie Sanchez [id number] Date: 01/08/2015 Reason for consult: Initial assessment   Maternal Data Has patient been taught Hand Expression?: Yes Does the patient have breastfeeding experience prior to this delivery?: Yes  Feeding    LATCH Score/Interventions       Type of Nipple: Everted at rest and after stimulation  Comfort (Breast/Nipple): Soft / non-tender     Hold (Positioning): No assistance needed to correctly position infant at breast.     Lactation Tools Discussed/Used     Consult Status Consult Status: Follow-up Date: 01/09/15 Follow-up type: In-patient    CHIDINMA, GARL 01/08/2015, 6:06 PM

## 2015-01-08 NOTE — Progress Notes (Signed)
  Subjective: Pushing since 0410, with good effort.  Patient had urge to push and wanted to initiate pushing.  Objective: BP 133/63 mmHg  Pulse 146  Temp(Src) 98.9 F (37.2 C) (Oral)  Resp 18  Ht 5\' 2"  (1.575 m)  Wt 90.719 kg (200 lb)  BMI 36.57 kg/m2  LMP 12/27/2013     FHT: Category 2--baseline 150-160, accels noted, moderate variability UC:   regular, every 2-3 minutes SVE:   Dilation: 10 Effacement (%): 100 Station: -1 Exam by:: Manfred Arch, CNM  Fetus OP Pitocin at 5 mu/min  Assessment:  2nd stage labor Likely LGA fetus GBS positive Rh negative  Plan: Dr. Dion Body updated Will continue pushing at present.  Nigel Bridgeman CNM 01/08/2015, 4:57 AM

## 2015-01-08 NOTE — Progress Notes (Signed)
UR chart review completed.  

## 2015-01-08 NOTE — Progress Notes (Signed)
  Subjective: Feeling some pressure, still complaining of some discomfort despite epidural  Objective: BP 128/69 mmHg  Pulse 130  Temp(Src) 98.9 F (37.2 C) (Oral)  Resp 18  Ht 5\' 2"  (1.575 m)  Wt 90.719 kg (200 lb)  BMI 36.57 kg/m2  LMP 12/27/2013      FHT: Category 1 UC:   Regular, 2-4 minutes SVE:   Dilation: Lip/rim Effacement (%): 100 Station: -1 Exam by:: Manfred Arch, CNM  Vtx much lower in pelvis. Rim on right side. MVUs 180-220 since 0031 (single 10' cycle 165) Pitocin at 4 mu/min Foley placed without difficulty  Assessment:  Transitional labor GBS positive Rh negative Suspected LGA infant  Plan: Continue current care--position on left side with peanut.  Nigel Bridgeman CNM 01/08/2015, 3:32 AM

## 2015-01-08 NOTE — Anesthesia Postprocedure Evaluation (Signed)
  Anesthesia Post-op Note  Patient: Angie Sanchez  Procedure(s) Performed: * No procedures listed *  Patient Location: Mother/Baby  Anesthesia Type:Epidural  Level of Consciousness: awake  Airway and Oxygen Therapy: Patient Spontanous Breathing  Post-op Pain: mild  Post-op Assessment: Patient's Cardiovascular Status Stable and Respiratory Function Stable              Post-op Vital Signs: stable  Last Vitals:  Filed Vitals:   01/08/15 1448  BP: 97/49  Pulse: 72  Temp: 37.1 C  Resp: 16    Complications: No apparent anesthesia complications

## 2015-01-09 LAB — CBC
HCT: 30 % — ABNORMAL LOW (ref 36.0–46.0)
HEMOGLOBIN: 9.8 g/dL — AB (ref 12.0–15.0)
MCH: 27.4 pg (ref 26.0–34.0)
MCHC: 32.7 g/dL (ref 30.0–36.0)
MCV: 83.8 fL (ref 78.0–100.0)
Platelets: 139 10*3/uL — ABNORMAL LOW (ref 150–400)
RBC: 3.58 MIL/uL — AB (ref 3.87–5.11)
RDW: 13.7 % (ref 11.5–15.5)
WBC: 11.1 10*3/uL — ABNORMAL HIGH (ref 4.0–10.5)

## 2015-01-09 NOTE — Plan of Care (Signed)
Problem: Phase II Progression Outcomes Goal: Rh isoimmunization per orders Outcome: Adequate for Discharge Infant A neg not needed

## 2015-01-09 NOTE — Progress Notes (Signed)
Angie Sanchez   Subjective: Post Partum Day 1 Vaginal delivery, no laceration Patient up ad lib, denies syncope or dizziness. Reports consuming regular diet without issues and denies N/V No issues with urination and reports bleeding is appropriate  Feeding:  brast Contraceptive plan:   condoms  Objective: Temp:  [98.3 F (36.8 C)-99 F (37.2 C)] 98.3 F (36.8 C) (06/10 2201) Pulse Rate:  [72-99] 84 (06/11 0642) Resp:  [16-20] 18 (06/11 0642) BP: (97-111)/(49-60) 110/59 mmHg (06/11 0642) SpO2:  [99 %] 99 % (06/11 1610)  Physical Exam:  General: alert and cooperative Ext: WNL, no edema. No evidence of DVT seen on physical exam. Breast: Soft filling Lungs: CTAB Heart RRR without murmur  Abdomen:  Soft, fundus firm, lochia scant, + bowel sounds, non distended, non tender Lochia: appropriate Uterine Fundus: firm Laceration: healing well    Recent Labs  01/07/15 0743 01/09/15 0553  HGB 11.7* 9.8*  HCT 34.8* 30.0*    Assessment S/P Vaginal Delivery-Day 1 Stable  Normal Involution Breastfeeding Circumcision: no circ Plan: Continue current care Plan for discharge tomorrow, Breastfeeding and Lactation consult Lactation support   Angie Sanchez, CNM, MSN 01/09/2015, 8:01 AM

## 2015-01-09 NOTE — Lactation Note (Signed)
This note was copied from the chart of Angie Sanchez. Lactation Consultation Note Follow up visit at 41 hours of age.  Baby has had 11 feedings in the past 24 hours with 2 voids and 4 stools.  Mom denies pain with latch or other concerns at this time.  Mom to call for assist as needed.    Patient Name: Angie Dawnesha Menke [id number] Date: 01/09/2015     Maternal Data    Feeding    LATCH Score/Interventions Latch: Grasps breast easily, tongue down, lips flanged, rhythmical sucking.  Audible Swallowing: A few with stimulation  Type of Nipple: Everted at rest and after stimulation  Comfort (Breast/Nipple): Soft / non-tender     Hold (Positioning): No assistance needed to correctly position infant at breast.  LATCH Score: 9  Lactation Tools Discussed/Used     Consult Status Consult Status: PRN    Jannifer Rodney 01/09/2015, 10:47 PM

## 2015-01-10 ENCOUNTER — Inpatient Hospital Stay (HOSPITAL_COMMUNITY): Payer: Medicaid Other

## 2015-01-10 MED ORDER — IBUPROFEN 600 MG PO TABS: 600.0000 mg | ORAL_TABLET | Freq: Four times a day (QID) | ORAL | Status: DC | PRN

## 2015-01-10 NOTE — Discharge Instructions (Signed)

## 2015-01-10 NOTE — Discharge Summary (Signed)
  Vaginal Delivery Discharge Summary  Angie Sanchez  DOB:    May 04, 1986 MRN:    [id number] CSN:    [id number]  Date of admission:                 01/07/15  Date of discharge:                   01/10/15  Procedures this admission:   SVB  Date of Delivery: 01/08/15  Newborn Data:  Live born female  Birth Weight: 10 lb (4536 g) APGAR: 7, 9  Home with mother. Name: Wilber Oliphant Circumcision Plan: None  History of Present Illness:  Angie Sanchez is a 29 y.o. female, [id number], who presents at [redacted]w[redacted]d weeks gestation. The patient has been followed at [facility] of [facility]. She was admitted for induction of labor due to polyhydramnios. Her pregnancy has been complicated by:  Patient Active Problem List   Diagnosis Date Noted  . Positive GBS test 01/08/2015  . LGA (large for gestational age) fetus 01/08/2015  . Polyhydramnios in third trimester 01/08/2015  . Rh negative, maternal 01/08/2015  . Vaginal delivery 01/08/2015     Hospital Course:   Admitting Dx: IUP at 40 6/7 weeks, polyhydramnios, LGA fetus, GBS positive, RH negative GBS Status:  Positive Delivering Clinician: Nigel Bridgeman, CNM Lacerations/MLE: None Complications: None Planned waterbirth.  Induction with foley bulb, with cervix advancement to 6 cm, but minimal change after that.  Elected to proceed with epidural at my recommendation, and pitocin augmentation.  Patient pushed 1 hr 24 min, with delivery over intact perineum of a 10 lb female.  PP recovery was uncomplicated.  Baby was Rh negative, so no Rhophylac was needed.  Intrapartum Procedures: spontaneous vaginal delivery Postpartum Procedures: none Complications-Operative and Postpartum: none  Discharge Diagnoses: Term Pregnancy-delivered, LGA fetus  Feeding:  breast  Contraception:  condoms  Hemoglobin Results:  CBC Latest Ref Rng 01/09/2015 01/07/2015 04/17/2013  WBC 4.0 - 10.5 K/uL 11.1(H)  8.2 7.3  Hemoglobin 12.0 - 15.0 g/dL 7.1(I) 11.7(L) 13.3  Hematocrit 36.0 - 46.0 % 30.0(L) 34.8(L) 39.0  Platelets 150 - 400 K/uL 139(L) 187 241.0    Discharge Physical Exam:   General: alert Lochia: appropriate Uterine Fundus: firm Incision: Intact perineum DVT Evaluation: No evidence of DVT seen on physical exam. Negative Homan's sign.   Discharge Information:  Activity:           pelvic rest Diet:                routine Medications: Ibuprofen Condition:      stable Instructions:  Routine pp instructions   Discharge to: home  Follow-up Information    Follow up with [facility] In 6 weeks.   Specialty:  Obstetrics and Gynecology   Why:  Call for any questions or concerns.   Contact information:   [address] [place] [address] [phone]       Nigel Bridgeman [facility] 01/10/2015 8:26 AM

## 2015-03-12 ENCOUNTER — Telehealth: Payer: Self-pay | Admitting: Family

## 2015-03-12 NOTE — Telephone Encounter (Signed)
Received records from [facility] forwarded 5 pages to Dr. Worthy Rancher via fax 03/12/15 fbg.

## 2018-01-05 ENCOUNTER — Emergency Department (HOSPITAL_COMMUNITY)
Admission: EM | Admit: 2018-01-05 | Discharge: 2018-01-05 | Disposition: A | Discharge status: Home / Self Care | Payer: Medicaid Other | Attending: Emergency Medicine | Admitting: Emergency Medicine

## 2018-01-05 ENCOUNTER — Encounter (HOSPITAL_COMMUNITY): Payer: Self-pay | Admitting: Emergency Medicine

## 2018-01-05 DIAGNOSIS — H9202 Otalgia, left ear: Secondary | ICD-10-CM | POA: Insufficient documentation

## 2018-01-05 DIAGNOSIS — Z79899 Other long term (current) drug therapy: Secondary | ICD-10-CM | POA: Insufficient documentation

## 2018-01-05 MED ORDER — AMOXICILLIN 500 MG PO CAPS: 500.0000 mg | ORAL_CAPSULE | Freq: Two times a day (BID) | ORAL | 0 refills | Status: AC

## 2018-01-05 NOTE — ED Provider Notes (Signed)
[facility]-EMERGENCY DEPT Provider Note   CSN: [id number] Arrival date & time: 01/05/18  0904     History   Chief Complaint Chief Complaint  Patient presents with  . Otalgia    left    HPI Angie Sanchez is a 32 y.o. female.  HPI 32 year old female presents emergency department with 3 days of left ear pain and left ear fullness.  No recent swimming.  No fevers or chills.  Some upper respiratory symptoms.  Patient is tried Tylenol and Claritin today without improvement in her symptoms.  She is approximately [redacted] weeks pregnant.  Symptoms are mild in severity.  No other complaints.   Past Medical History:  Diagnosis Date  . H/O varicella   . Headache(784.0)   . Tinnitus    since 2015  . Urinary tract infection     Patient Active Problem List   Diagnosis Date Noted  . Positive GBS test 01/08/2015  . LGA (large for gestational age) fetus 01/08/2015  . Polyhydramnios in third trimester 01/08/2015  . Rh negative, maternal 01/08/2015  . Vaginal delivery 01/08/2015    Past Surgical History:  Procedure Laterality Date  . nodule removal     from knee fingers and toes  . OTHER SURGICAL HISTORY  2001-2002   foot, knee, and finger (granulomas)  . WISDOM TOOTH EXTRACTION       OB History    Gravida  4   Para  3   Term  3   Preterm      AB      Living  2     SAB      TAB      Ectopic      Multiple  0   Live Births  2            Home Medications    Prior to Admission medications   Medication Sig Start Date End Date Taking? Authorizing Provider  amoxicillin (AMOXIL) 500 MG capsule Take 1 capsule (500 mg total) by mouth 2 (two) times daily for 5 days. 01/05/18 01/10/18  Azalia Bilisampos, Efren Kross, MD  calcium carbonate (TUMS - DOSED IN MG ELEMENTAL CALCIUM) 500 MG chewable tablet Chew 1 tablet by mouth daily.    [provider]  ibuprofen (ADVIL,MOTRIN) 600 MG tablet Take 1 tablet (600 mg total) by mouth every 6 (six) hours as needed.  01/10/15   Nigel BridgemanLatham, Vicki, CNM  loratadine (CLARITIN) 10 MG tablet Take 10 mg by mouth daily as needed for allergies.     [provider]  Prenatal Vit-Fe Fumarate-FA (PRENATAL MULTIVITAMIN) TABS tablet Take 1 tablet by mouth daily at 12 noon.    [provider]    Family History Family History  Problem Relation Age of Onset  . Arthritis Maternal Grandmother   . Hypertension Maternal Grandmother   . Arthritis Maternal Grandfather   . Diabetes Maternal Grandfather   . Heart disease Maternal Grandfather   . Hypertension Maternal Grandfather   . Depression Maternal Grandfather   . Hypertension Mother   . Depression Mother   . Hyperlipidemia Father   . Depression Father   . Cancer Cousin        breast  . Hypertension Paternal Grandmother   . Parkinson's disease Paternal Grandmother     Social History Social History   Tobacco Use  . Smoking status: Never Smoker  . Smokeless tobacco: Never Used  Substance Use Topics  . Alcohol use: No    Comment: rarely  .  Drug use: No     Allergies   Patient has no known allergies.   Review of Systems Review of Systems  All other systems reviewed and are negative.    Physical Exam Updated Vital Signs BP 112/61 (BP Location: Right Arm)   Pulse 74   Temp 98.5 F (36.9 C) (Oral)   Resp 17   LMP 11/10/2017   SpO2 100%   Physical Exam  Constitutional: She is oriented to person, place, and time. She appears well-developed and well-nourished.  HENT:  Head: Normocephalic.  Mouth/Throat: Oropharynx is clear and moist.  Right TM normal.  Left external ear normal.  Left external auditory canal normal.  Left TM with mild erythema and some fluid behind the left TM with mild bulging.  Eyes: EOM are normal.  Neck: Normal range of motion.  Cardiovascular: Normal rate.  Pulmonary/Chest: Effort normal.  Abdominal: She exhibits no distension.  Musculoskeletal: Normal range of motion.  Neurological: She is alert and  oriented to person, place, and time.  Psychiatric: She has a normal mood and affect.  Nursing note and vitals reviewed.    ED Treatments / Results  Labs (all labs ordered are listed, but only abnormal results are displayed) Labs Reviewed - No data to display  EKG None  Radiology No results found.  Procedures Procedures (including critical care time)  Medications Ordered in ED Medications - No data to display   Initial Impression / Assessment and Plan / ED Course  I have reviewed the triage vital signs and the nursing notes.  Pertinent labs & imaging results that were available during my care of the patient were reviewed by me and considered in my medical decision making (see chart for details).     Patient will be treated as developing acute left otitis media.  Home with antibiotics.  Benadryl as needed.  Tylenol for pain.  Primary care follow-up.  Final Clinical Impressions(s) / ED Diagnoses   Final diagnoses:  Otalgia of left ear    ED Discharge Orders        Ordered    amoxicillin (AMOXIL) 500 MG capsule  2 times daily     01/05/18 1610       Azalia Bilis, MD 01/05/18 [phone]

## 2018-01-05 NOTE — ED Triage Notes (Signed)
Pt c/o left ear pain for couple days. Also had congestion. Pt is pregnant, approx 8 weeks.

## 2018-01-05 NOTE — Discharge Instructions (Signed)
Use benadryl every 6 hours as needed

## 2018-01-05 NOTE — ED Notes (Signed)
Bed: WTR5 Expected date:  Expected time:  Means of arrival:  Comments: 

## 2018-01-22 ENCOUNTER — Inpatient Hospital Stay (HOSPITAL_COMMUNITY)
Admission: AD | Admit: 2018-01-22 | Discharge: 2018-01-23 | Disposition: A | Discharge status: Home / Self Care | Payer: Medicaid Other | Source: Ambulatory Visit | Attending: Obstetrics and Gynecology | Admitting: Obstetrics and Gynecology

## 2018-01-22 ENCOUNTER — Inpatient Hospital Stay (HOSPITAL_COMMUNITY): Payer: Medicaid Other

## 2018-01-22 ENCOUNTER — Other Ambulatory Visit: Payer: Self-pay

## 2018-01-22 ENCOUNTER — Encounter (HOSPITAL_COMMUNITY): Payer: Self-pay

## 2018-01-22 DIAGNOSIS — Z3A10 10 weeks gestation of pregnancy: Secondary | ICD-10-CM | POA: Insufficient documentation

## 2018-01-22 DIAGNOSIS — J9811 Atelectasis: Secondary | ICD-10-CM | POA: Insufficient documentation

## 2018-01-22 DIAGNOSIS — M791 Myalgia, unspecified site: Secondary | ICD-10-CM

## 2018-01-22 DIAGNOSIS — J90 Pleural effusion, not elsewhere classified: Secondary | ICD-10-CM | POA: Diagnosis not present

## 2018-01-22 DIAGNOSIS — O26891 Other specified pregnancy related conditions, first trimester: Secondary | ICD-10-CM | POA: Insufficient documentation

## 2018-01-22 DIAGNOSIS — R079 Chest pain, unspecified: Secondary | ICD-10-CM | POA: Diagnosis present

## 2018-01-22 LAB — URINALYSIS, ROUTINE W REFLEX MICROSCOPIC
Bacteria, UA: NONE SEEN
Bilirubin Urine: NEGATIVE
Glucose, UA: NEGATIVE mg/dL
Ketones, ur: NEGATIVE mg/dL
Leukocytes, UA: NEGATIVE
Nitrite: NEGATIVE
Protein, ur: NEGATIVE mg/dL
Specific Gravity, Urine: 1.024 (ref 1.005–1.030)
pH: 5 (ref 5.0–8.0)

## 2018-01-22 LAB — CBC
HCT: 37.3 % (ref 36.0–46.0)
Hemoglobin: 12.8 g/dL (ref 12.0–15.0)
MCH: 29.7 pg (ref 26.0–34.0)
MCHC: 34.3 g/dL (ref 30.0–36.0)
MCV: 86.5 fL (ref 78.0–100.0)
Platelets: 208 10*3/uL (ref 150–400)
RBC: 4.31 MIL/uL (ref 3.87–5.11)
RDW: 13 % (ref 11.5–15.5)
WBC: 8.4 10*3/uL (ref 4.0–10.5)

## 2018-01-22 LAB — D-DIMER, QUANTITATIVE (NOT AT ARMC): D DIMER QUANT: 0.37 ug{FEU}/mL (ref 0.00–0.50)

## 2018-01-22 LAB — TROPONIN I: Troponin I: <0.03 ng/mL (ref <0.03)

## 2018-01-22 IMAGING — CR DG CHEST 2V
2 series · 2 of 2 positions shown · non-contrast
Comparison: None.

CLINICAL DATA: Chest pain

EXAM:
CHEST - 2 VIEW

[chest pa]
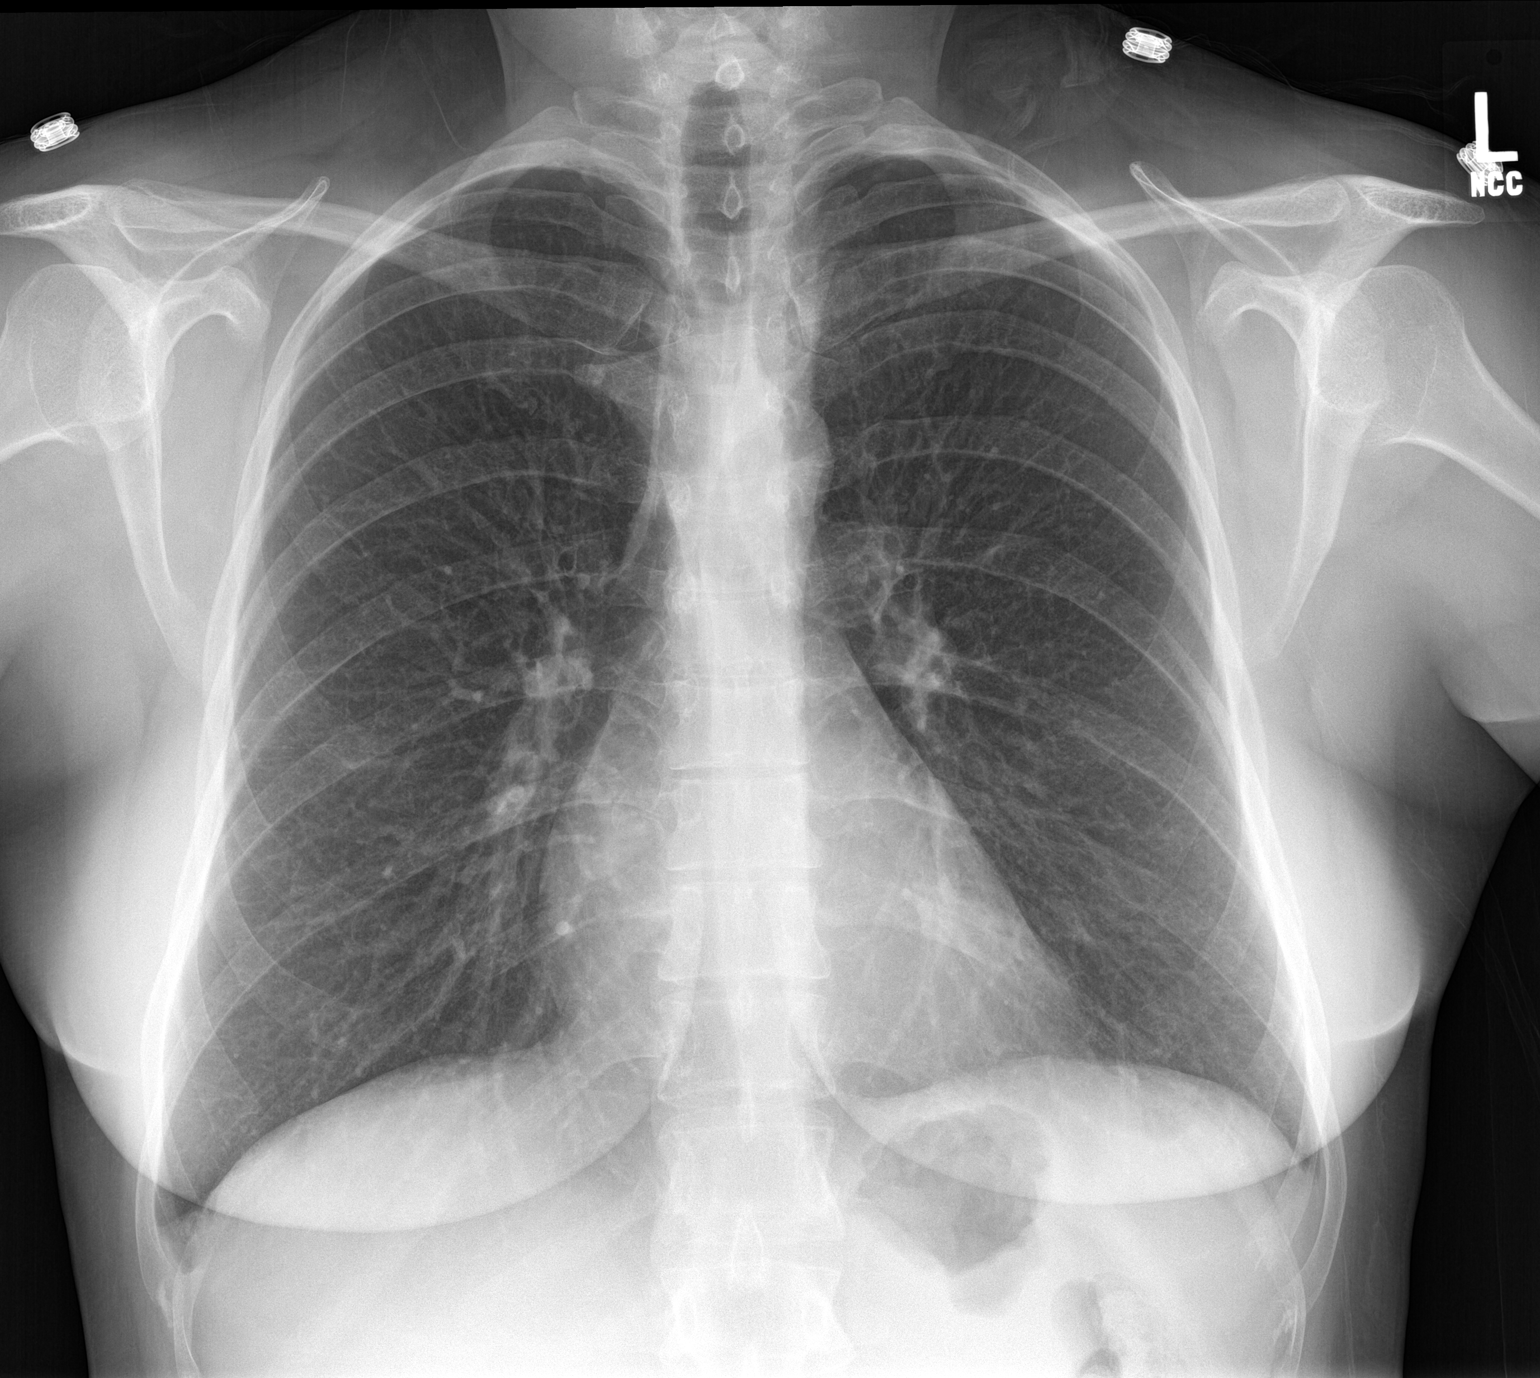

[chest lat]
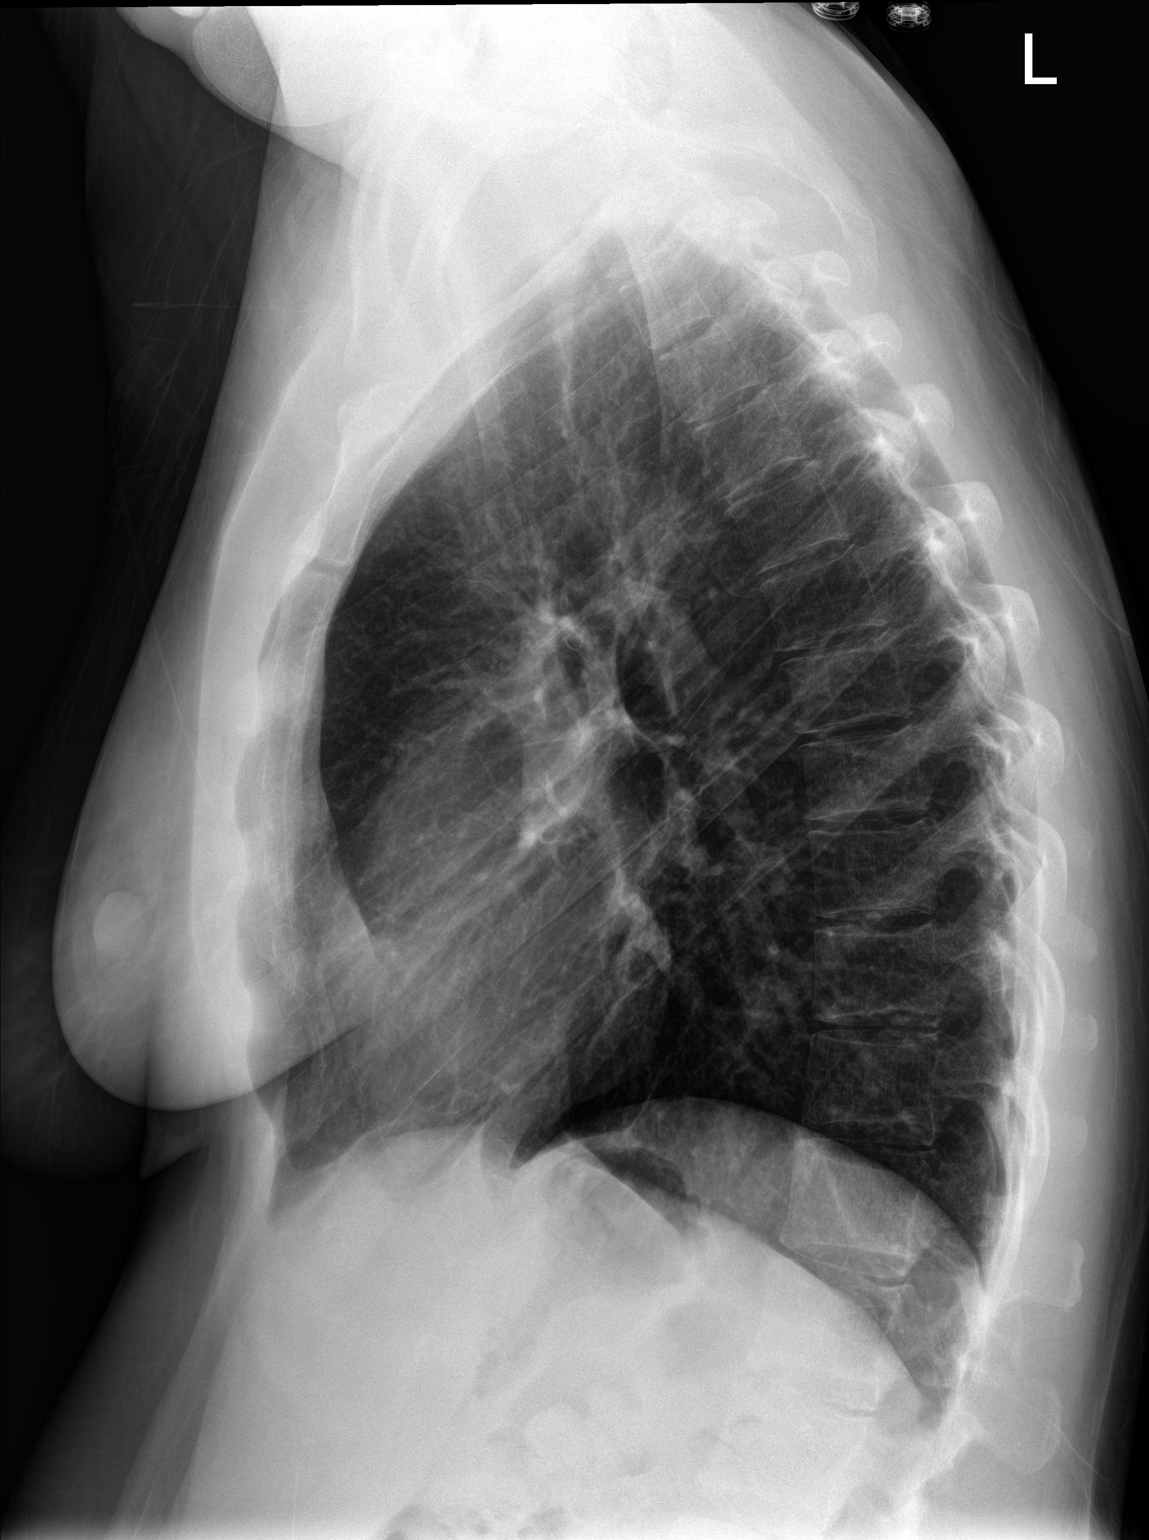

[2 of 2 positions shown; findings below may reference images not displayed]

FINDINGS: Normal heart size. Normal mediastinal contour. No pneumothorax. No
pleural effusion. Lungs appear clear, with no acute consolidative
airspace disease and no pulmonary edema.
IMPRESSION: No active cardiopulmonary disease.

## 2018-01-22 MED ORDER — CYCLOBENZAPRINE HCL 10 MG PO TABS
10.0000 mg | ORAL_TABLET | Freq: Once | ORAL | Status: AC
  Administered 2018-01-22: 10 mg via ORAL
  Filled 2018-01-22: qty 1

## 2018-01-22 MED ORDER — IOPAMIDOL (ISOVUE-370) INJECTION 76%
100.0000 mL | Freq: Once | INTRAVENOUS | Status: AC | PRN
  Administered 2018-01-22: 100 mL via INTRAVENOUS

## 2018-01-22 MED ORDER — ACETAMINOPHEN 500 MG PO TABS
1000.0000 mg | ORAL_TABLET | Freq: Once | ORAL | Status: AC
  Administered 2018-01-22: 1000 mg via ORAL
  Filled 2018-01-22: qty 2

## 2018-01-22 MED ORDER — GI COCKTAIL ~~LOC~~
30.0000 mL | Freq: Once | ORAL | Status: AC
  Administered 2018-01-22: 30 mL via ORAL
  Filled 2018-01-22: qty 30

## 2018-01-22 NOTE — MAU Note (Signed)
About a wk ago, woke up with a weird feeling in her chest.  Kind of went away.  Since Sunday, started have severe- sharp pain up underneath her left breast,  When she lays down on left side, or sits- it hurts really bad,is hard to breath. Tightness in neck, left shoulder- arm and down her side.  Is able to pinpoint the area.

## 2018-01-22 NOTE — MAU Provider Note (Addendum)
History     CSN: [id number]  Arrival date & time 01/22/18  1511   None     Chief Complaint  Patient presents with  . Chest Pain    Angie Sanchez 32 y.o. [id number] @ 423w3d presents to MAU with chest pain that is worse when she is lying down. Denies vaginal bleeding, cramping, LOF.  rn Note: About a wk ago, woke up with a weird feeling in her chest.  Kind of went away.  Since Sunday, started have severe- sharp pain up underneath her left breast,  When she lays down on left side, or sits- it hurts really bad,is hard to breath. Tightness in neck, left shoulder- arm and down her side.  Is able to pinpoint the area.            Past Medical History:  Diagnosis Date  . H/O varicella   . Headache(784.0)   . Tinnitus    since 2015  . Urinary tract infection     Past Surgical History:  Procedure Laterality Date  . nodule removal     from knee fingers and toes  . OTHER SURGICAL HISTORY  2001-2002   foot, knee, and finger (granulomas)  . WISDOM TOOTH EXTRACTION      Family History  Problem Relation Age of Onset  . Arthritis Maternal Grandmother   . Hypertension Maternal Grandmother   . Arthritis Maternal Grandfather   . Diabetes Maternal Grandfather   . Heart disease Maternal Grandfather   . Hypertension Maternal Grandfather   . Depression Maternal Grandfather   . Hypertension Mother   . Depression Mother   . Hyperlipidemia Father   . Depression Father   . Cancer Cousin        breast  . Hypertension Paternal Grandmother   . Parkinson's disease Paternal Grandmother     Social History   Tobacco Use  . Smoking status: Never Smoker  . Smokeless tobacco: Never Used  Substance Use Topics  . Alcohol use: No    Comment: rarely  . Drug use: No    OB History    Gravida  4   Para  3   Term  3   Preterm      AB      Living  3     SAB      TAB      Ectopic      Multiple  0   Live Births  2           Review of Systems  Respiratory:  Positive for shortness of breath.   Cardiovascular: Positive for chest pain. Negative for palpitations and leg swelling.  All other systems reviewed and are negative.   Allergies  Patient has no known allergies.  Home Medications    BP (!) 142/66 (BP Location: Right Arm)   Pulse 80   Temp 98.1 F (36.7 C) (Oral)   Resp 18   Wt 165 lb 4 oz (75 kg)   LMP 11/10/2017   SpO2 100%   BMI 30.22 kg/m   Physical Exam  Constitutional: She is oriented to person, place, and time. She appears well-developed and well-nourished. She does not appear ill.  Neck: Normal range of motion. Neck supple.  Cardiovascular: Normal rate and regular rhythm.  Pulmonary/Chest: Effort normal and breath sounds normal.  Abdominal: Soft. Bowel sounds are normal.  Musculoskeletal:       Right lower leg: Normal.       Left lower leg: Normal.  Neurological: She is alert and oriented to person, place, and time.  Skin: Skin is warm and dry.  Psychiatric: She has a normal mood and affect. Her behavior is normal.  Nursing note and vitals reviewed.   MAU Course  Procedures (including critical care time)  Labs Reviewed  URINALYSIS, ROUTINE W REFLEX MICROSCOPIC - Abnormal; Notable for the following components:      Result Value   Hgb urine dipstick SMALL (*)    All other components within normal limits  CBC  TROPONIN I  Troponin  0.03 NML CBC    Component Value Date/Time   WBC 8.4 01/22/2018 1718   RBC 4.31 01/22/2018 1718   HGB 12.8 01/22/2018 1718   HCT 37.3 01/22/2018 1718   PLT 208 01/22/2018 1718   MCV 86.5 01/22/2018 1718   MCH 29.7 01/22/2018 1718   MCHC 34.3 01/22/2018 1718   RDW 13.0 01/22/2018 1718   LYMPHSABS 2.3 04/17/2013 1636   MONOABS 0.5 04/17/2013 1636   EOSABS 0.3 04/17/2013 1636   BASOSABS 0.0 04/17/2013 1636   Dg Chest 2 View  Result Date: 01/22/2018 CLINICAL DATA:  Chest pain EXAM: CHEST - 2 VIEW COMPARISON:  None. FINDINGS: Normal heart size. Normal mediastinal contour.  No pneumothorax. No pleural effusion. Lungs appear clear, with no acute consolidative airspace disease and no pulmonary edema. IMPRESSION: No active cardiopulmonary disease. Electronically Signed   By: Delbert Phenix M.D.   On: 01/22/2018 17:11     1. Chest pain       MDM  Pt has been given Gi Cocktail with no relief; Chest Xray and EKG are wnl. Troponin Negative. Will try tylenol and muscle relaxer to r/o Musculoskeletal vs PE Discussed Plan of Care with Dr Estanislado Pandy and she will sign out to Dr. Normand Sloop.   Kathalene Frames to assume care of pt at 700pm  [facility] CNM  Consulted Dr. Normand Sloop, did CT of the lungs with contrast to rule out PE. D-dimer was not elevated and CT scan showed no evidence of PE. Will discharge home with Flexeril 10mg  PO TID prn chest pain and tell patient to take Tylenol OTC. Patient should follow up in office on Friday.  Janeece Riggers 1:55 AM 01/23/18

## 2018-01-23 IMAGING — CT CT ANGIO CHEST
1 of 4 series · 18 of 31 positions shown · IV contrast (OMNIPAQUE)
Comparison: None.

CLINICAL DATA: Severe sharp pain beneath left breast. Difficult to
breathe. Tightness in the neck.

EXAM:
CT ANGIOGRAPHY CHEST WITH CONTRAST
TECHNIQUE: Multidetector CT imaging of the chest was performed using the
standard protocol during bolus administration of intravenous
contrast. Multiplanar CT image reconstructions and MIPs were
obtained to evaluate the vascular anatomy.
CONTRAST:  100mL [UF] IOPAMIDOL ([UF]) INJECTION 76%

[Series 5: (person_name) thins · axial · 0.59mm/px · z∈[+938,+1171]mm · 18 of 373 slices shown]
[im 20/373  lung]
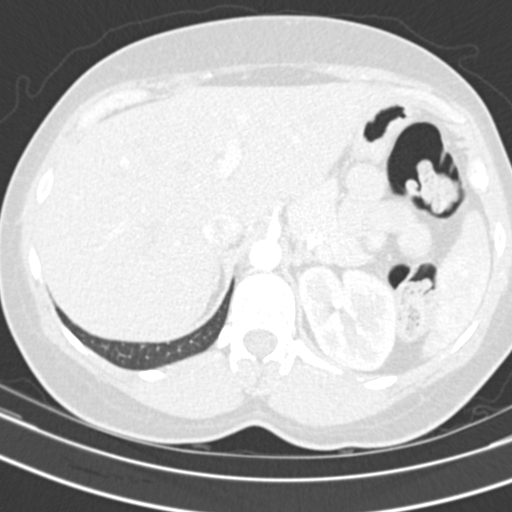
[im 40/373  mediastinal]
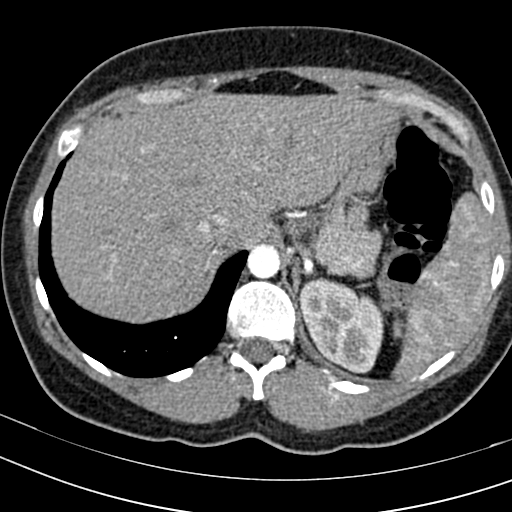
[im 59/373  lung]
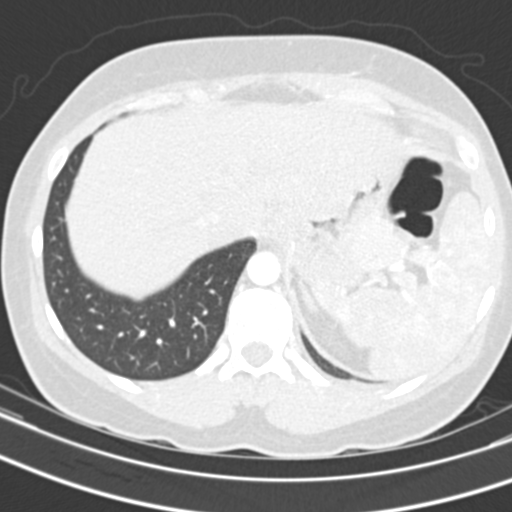
[im 79/373  mediastinal]
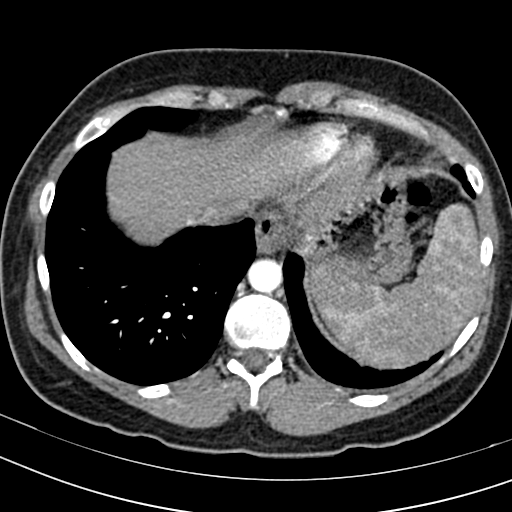
[im 118/373  lung]
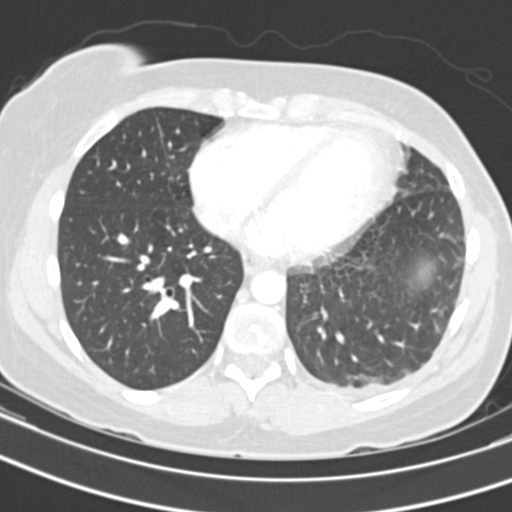
[im 138/373  mediastinal]
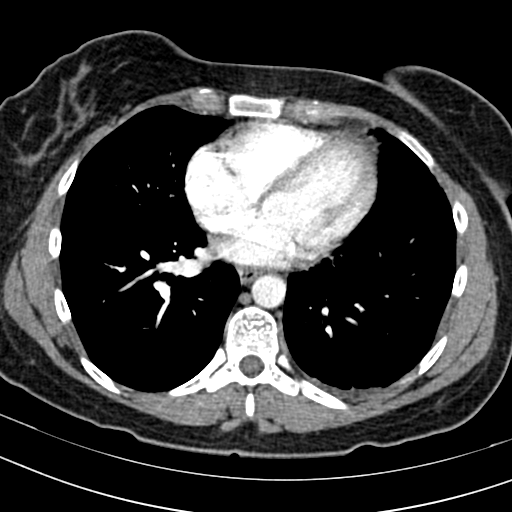
[im 157/373  lung]
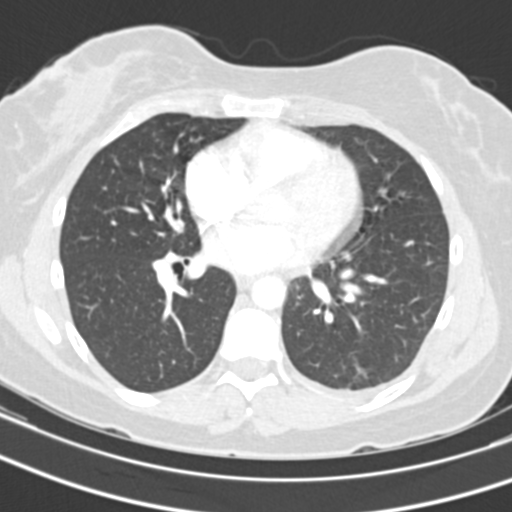
[im 173/373  mediastinal]
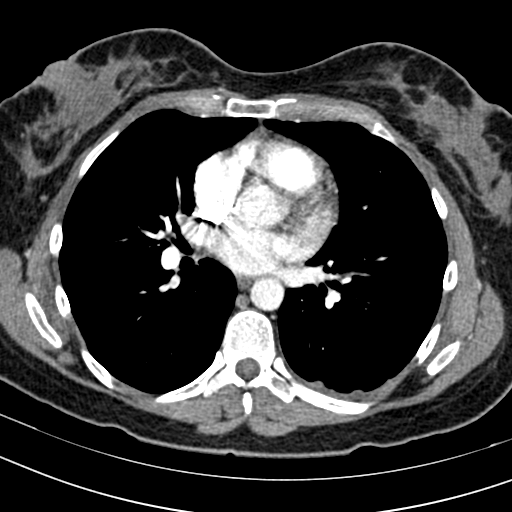
[im 177/373  lung]
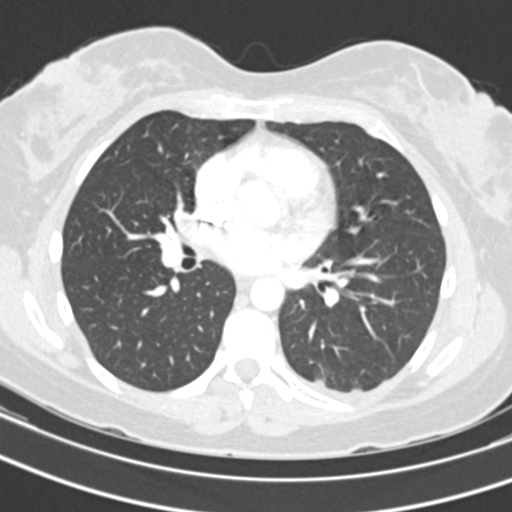
[im 187/373  mediastinal]
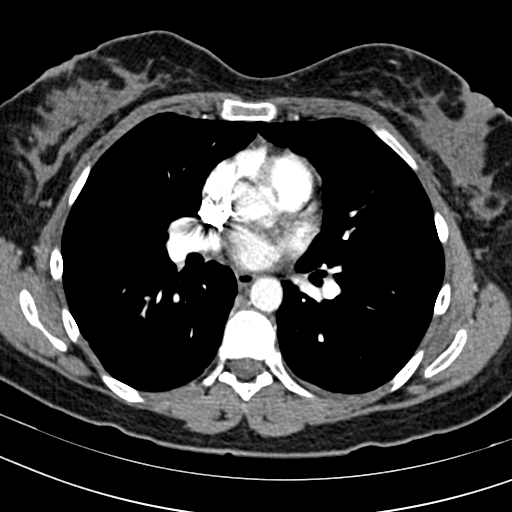
[im 196/373  lung]
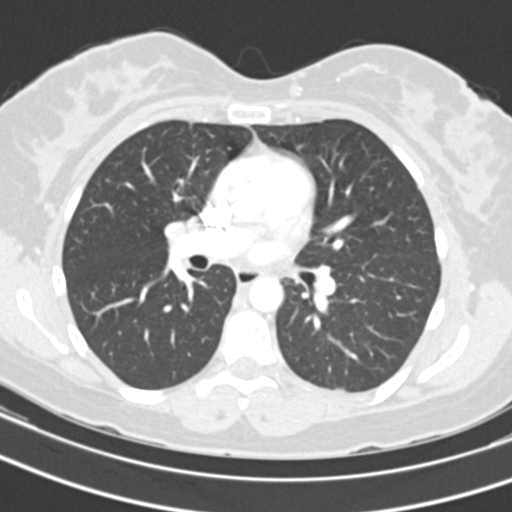
[im 216/373  mediastinal]
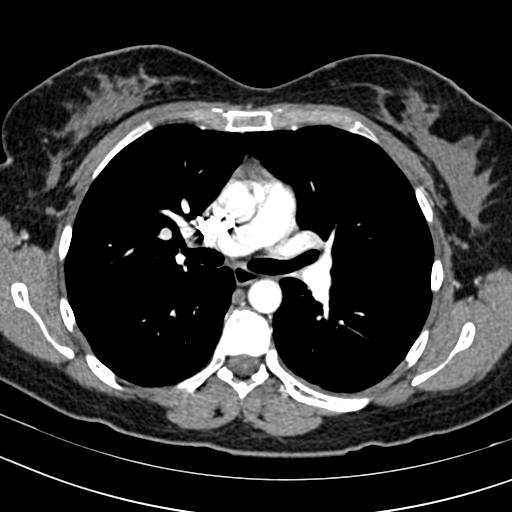
[im 235/373  lung]
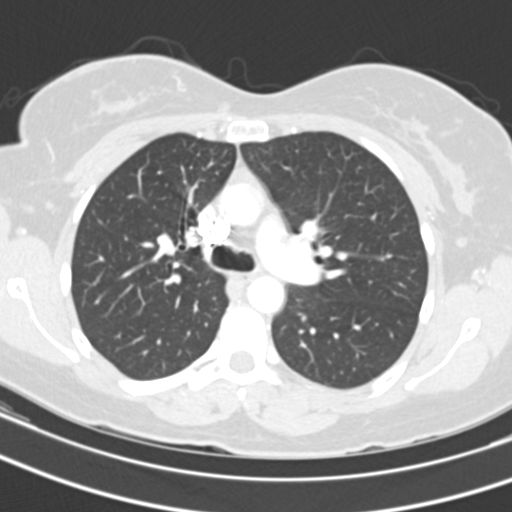
[im 275/373  mediastinal]
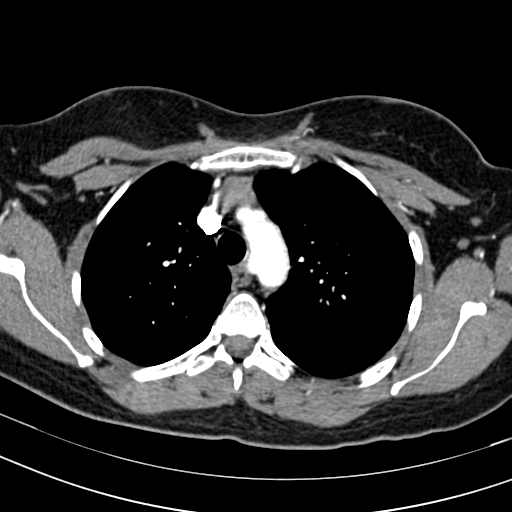
[im 294/373  lung]
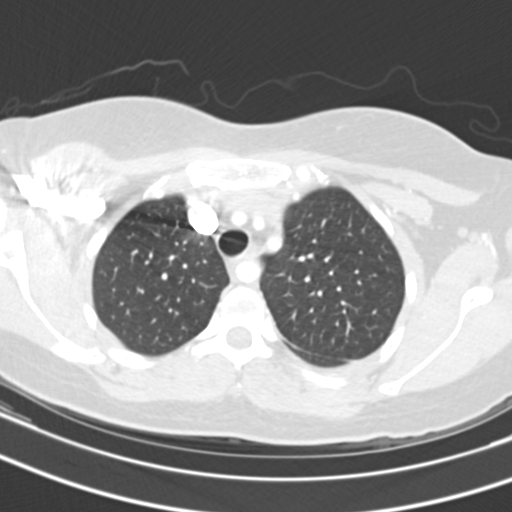
[im 314/373  mediastinal]
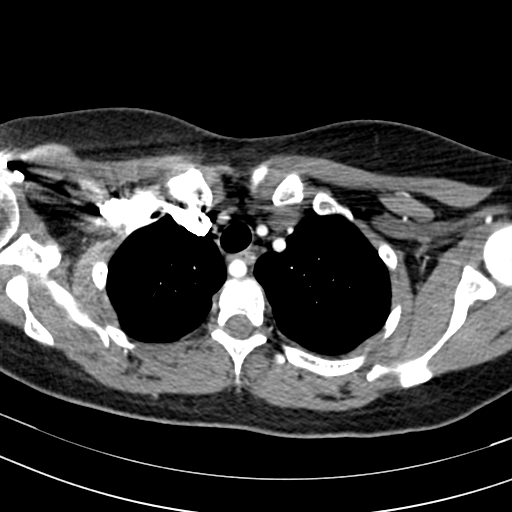
[im 333/373  lung]
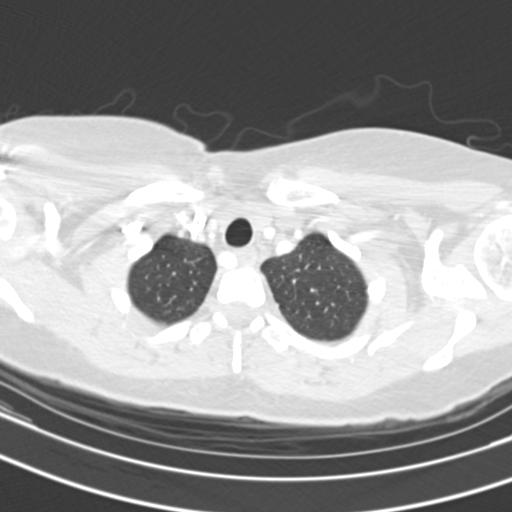
[im 353/373  mediastinal]
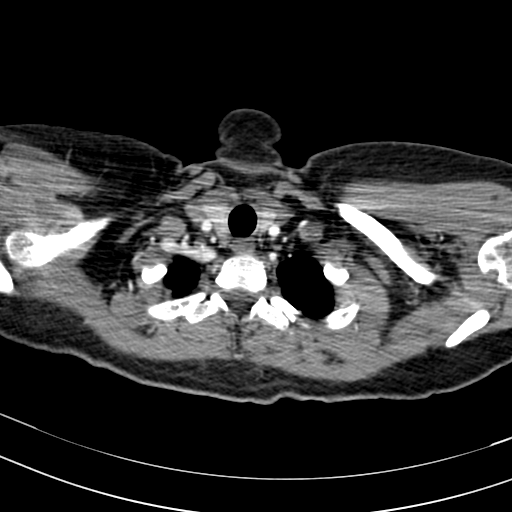

[18 of 31 positions shown; findings below may reference images not displayed]

FINDINGS: Cardiovascular: Left aortic arch with aberrant right subclavian
artery coursing posterior to the esophagus. Direct takeoff of the
right common carotid artery from the aortic arch. Unusual appearance
of the ascending aorta with linear hypodensity traversing the right
atrium, ascending aorta and main pulmonary artery simulating aortic
dissection but given involvement of the additional cardiac and
pulmonary arterial structures, findings favor artifact, series [DATE]
through 65.. Repeat imaging with cardiac gating or MRI may help for
better clarification if necessary. Heart is normal in size without
pericardial effusion. Satisfactory opacification of the pulmonary
arteries without pulmonary embolus.

Mediastinum/Nodes: No enlarged mediastinal, hilar, or axillary lymph
nodes. Thyroid gland, trachea, and esophagus demonstrate no
significant findings.

Lungs/Pleura: Minimal subpleural atelectasis and trace left
effusion. Small foci of pneumonia are not entirely excluded at the
left lung base. Lingular atelectasis also noted. No pneumothorax. No
dominant mass.

Upper Abdomen: No acute abnormality.

Musculoskeletal: No chest wall abnormality. No acute or significant
osseous findings.

Review of the MIP images confirms the above findings.
IMPRESSION: 1. No acute pulmonary embolus.
2. No aortic aneurysm. A streak artifact across the ascending aorta
simulates an aortic dissection but appears to extend beyond confines
of the aorta and is most consistent with artifact.
3. Normal variant aberrant right subclavian artery.
4. Trace left effusion with left basilar atelectasis. Small foci of
pneumonia are not entirely excluded but believed less likely.

## 2018-04-04 ENCOUNTER — Emergency Department (HOSPITAL_COMMUNITY)
Admission: EM | Admit: 2018-04-04 | Discharge: 2018-04-04 | Disposition: A | Discharge status: Home / Self Care | Payer: Medicaid Other | Attending: Emergency Medicine | Admitting: Emergency Medicine

## 2018-04-04 ENCOUNTER — Encounter (HOSPITAL_COMMUNITY): Payer: Self-pay

## 2018-04-04 DIAGNOSIS — H6692 Otitis media, unspecified, left ear: Secondary | ICD-10-CM | POA: Diagnosis not present

## 2018-04-04 DIAGNOSIS — H9202 Otalgia, left ear: Secondary | ICD-10-CM | POA: Diagnosis present

## 2018-04-04 DIAGNOSIS — H6690 Otitis media, unspecified, unspecified ear: Secondary | ICD-10-CM

## 2018-04-04 DIAGNOSIS — Z79899 Other long term (current) drug therapy: Secondary | ICD-10-CM | POA: Insufficient documentation

## 2018-04-04 MED ORDER — AMOXICILLIN 500 MG PO CAPS: 500.0000 mg | ORAL_CAPSULE | Freq: Three times a day (TID) | ORAL | 0 refills | Status: DC

## 2018-04-04 NOTE — ED Triage Notes (Signed)
Pt complains of left ear pain and congestion for two weeks

## 2018-04-04 NOTE — ED Provider Notes (Signed)
[facility]-EMERGENCY DEPT Provider Note   CSN: [id number] Arrival date & time: 04/04/18  8916     History   Chief Complaint Chief Complaint  Patient presents with  . Otalgia    HPI Angie Sanchez is a 32 y.o. female.  She presents with 1 week of left ear pain, nasal congestion, postnasal drip with cough and some facial pressure.  There is been no fever.  She has a history of recurrent ear infections and sinusitis.  She had this last month and took a course of amoxicillin with good improvement in her symptoms.  She is been using Flonase Benadryl Claritin and Tylenol with minimal improvement.  She is [redacted] weeks pregnant having no complications from her pregnancy.  The history is provided by the patient.  Otalgia  This is a recurrent problem. The current episode started more than 1 week ago. There is pain in the left ear. The problem occurs constantly. The problem has not changed since onset.There has been no fever. The pain is moderate. Associated symptoms include hearing loss, rhinorrhea, sore throat and cough. Pertinent negatives include no ear discharge, no headaches, no abdominal pain, no diarrhea, no vomiting, no neck pain and no rash. Past medical history comments: tinnitus.    Past Medical History:  Diagnosis Date  . H/O varicella   . Headache(784.0)   . Tinnitus    since 2015  . Urinary tract infection     Patient Active Problem List   Diagnosis Date Noted  . Positive GBS test 01/08/2015  . LGA (large for gestational age) fetus 01/08/2015  . Polyhydramnios in third trimester 01/08/2015  . Rh negative, maternal 01/08/2015  . Vaginal delivery 01/08/2015    Past Surgical History:  Procedure Laterality Date  . nodule removal     from knee fingers and toes  . OTHER SURGICAL HISTORY  2001-2002   foot, knee, and finger (granulomas)  . WISDOM TOOTH EXTRACTION       OB History    Gravida  4   Para  3   Term  3   Preterm      AB      Living  3     SAB      TAB      Ectopic      Multiple  0   Live Births  2            Home Medications    Prior to Admission medications   Medication Sig Start Date End Date Taking? Authorizing Provider  amoxicillin (AMOXIL) 500 MG capsule Take 1 capsule (500 mg total) by mouth 3 (three) times daily. 04/04/18   Terrilee Files, MD  calcium carbonate (TUMS - DOSED IN MG ELEMENTAL CALCIUM) 500 MG chewable tablet Chew 1 tablet by mouth daily.    [provider]  ibuprofen (ADVIL,MOTRIN) 600 MG tablet Take 1 tablet (600 mg total) by mouth every 6 (six) hours as needed. 01/10/15   Nigel Bridgeman, CNM  loratadine (CLARITIN) 10 MG tablet Take 10 mg by mouth daily as needed for allergies.     [provider]  Prenatal Vit-Fe Fumarate-FA (PRENATAL MULTIVITAMIN) TABS tablet Take 1 tablet by mouth daily at 12 noon.    [provider]    Family History Family History  Problem Relation Age of Onset  . Arthritis Maternal Grandmother   . Hypertension Maternal Grandmother   . Arthritis Maternal Grandfather   . Diabetes Maternal Grandfather   . Heart  disease Maternal Grandfather   . Hypertension Maternal Grandfather   . Depression Maternal Grandfather   . Hypertension Mother   . Depression Mother   . Hyperlipidemia Father   . Depression Father   . Cancer Cousin        breast  . Hypertension Paternal Grandmother   . Parkinson's disease Paternal Grandmother     Social History Social History   Tobacco Use  . Smoking status: Never Smoker  . Smokeless tobacco: Never Used  Substance Use Topics  . Alcohol use: No    Comment: rarely  . Drug use: No     Allergies   Patient has no known allergies.   Review of Systems Review of Systems  Constitutional: Negative for fever.  HENT: Positive for ear pain, hearing loss, rhinorrhea and sore throat. Negative for ear discharge.   Eyes: Negative for pain and visual disturbance.  Respiratory: Positive for  cough. Negative for shortness of breath.   Cardiovascular: Negative for chest pain.  Gastrointestinal: Negative for abdominal pain, diarrhea and vomiting.  Genitourinary: Negative for dysuria.  Musculoskeletal: Negative for neck pain.  Skin: Negative for rash.  Neurological: Negative for headaches.     Physical Exam Updated Vital Signs BP 126/71 (BP Location: Left Arm)   Pulse (!) 110   Temp 98.8 F (37.1 C) (Oral)   Resp 15   LMP 11/10/2017 Comment: Patient shield  SpO2 99%   Physical Exam  Constitutional: She appears well-developed and well-nourished.  HENT:  Head: Normocephalic and atraumatic.  Right Ear: Hearing, tympanic membrane, external ear and ear canal normal.  Left Ear: External ear normal. No foreign bodies. No mastoid tenderness. Tympanic membrane is injected. Tympanic membrane is not perforated. A middle ear effusion is present.  Nose: Nose normal.  Mouth/Throat: Uvula is midline and mucous membranes are normal. Posterior oropharyngeal erythema present. No oropharyngeal exudate or tonsillar abscesses.  Eyes: Pupils are equal, round, and reactive to light. Conjunctivae and EOM are normal. Right eye exhibits no discharge. Left eye exhibits no discharge.  Neck: Neck supple.  Cardiovascular: Normal rate, regular rhythm and normal heart sounds.  Pulmonary/Chest: Effort normal and breath sounds normal. She has no wheezes. She has no rales.  Lymphadenopathy:    She has no cervical adenopathy.  Neurological: She is alert. GCS eye subscore is 4. GCS verbal subscore is 5. GCS motor subscore is 6.  Skin: Skin is warm and dry.  Psychiatric: She has a normal mood and affect.  Nursing note and vitals reviewed.    ED Treatments / Results  Labs (all labs ordered are listed, but only abnormal results are displayed) Labs Reviewed - No data to display  EKG None  Radiology No results found.  Procedures Procedures (including critical care time)  Medications Ordered in  ED Medications - No data to display   Initial Impression / Assessment and Plan / ED Course  I have reviewed the triage vital signs and the nursing notes.  Pertinent labs & imaging results that were available during my care of the patient were reviewed by me and considered in my medical decision making (see chart for details).      Final Clinical Impressions(s) / ED Diagnoses   Final diagnoses:  Acute otitis media, unspecified otitis media type    ED Discharge Orders         Ordered    amoxicillin (AMOXIL) 500 MG capsule  3 times daily     04/04/18 0708  Terrilee Files, MD 04/04/18 [phone]

## 2018-04-04 NOTE — Discharge Instructions (Signed)
You were evaluated in the emergency department for left ear pain in the setting of congestion runny nose and cough.  He should continue to take Tylenol as needed for pain and can use Claritin or Benadryl as a antihistamine.  Continue the Flonase.  We are prescribing you amoxicillin to take 3 times a day for 10 days.  Please update your OB regarding this medication addition.  Return if any concerns.

## 2018-04-07 ENCOUNTER — Encounter (HOSPITAL_COMMUNITY): Payer: Self-pay | Admitting: Emergency Medicine

## 2018-04-07 ENCOUNTER — Emergency Department (HOSPITAL_COMMUNITY)
Admission: EM | Admit: 2018-04-07 | Discharge: 2018-04-07 | Disposition: A | Discharge status: Home / Self Care | Payer: Medicaid Other | Attending: Emergency Medicine | Admitting: Emergency Medicine

## 2018-04-07 DIAGNOSIS — J329 Chronic sinusitis, unspecified: Secondary | ICD-10-CM | POA: Diagnosis not present

## 2018-04-07 DIAGNOSIS — H9203 Otalgia, bilateral: Secondary | ICD-10-CM | POA: Diagnosis present

## 2018-04-07 DIAGNOSIS — Z79899 Other long term (current) drug therapy: Secondary | ICD-10-CM | POA: Diagnosis not present

## 2018-04-07 MED ORDER — OXYMETAZOLINE HCL 0.05 % NA SOLN: 1.0000 | Freq: Two times a day (BID) | NASAL | 0 refills | Status: AC

## 2018-04-07 MED ORDER — AMOXICILLIN-POT CLAVULANATE 875-125 MG PO TABS: 1.0000 | ORAL_TABLET | Freq: Two times a day (BID) | ORAL | 0 refills | Status: AC

## 2018-04-07 NOTE — Discharge Instructions (Addendum)
Please use the medications that were provided for you on your discharge paperwork.  Do not use the Afrin nasal spray for more than 3 days as it can cause rebound nasal congestion.  Please follow-up with your regular doctor in the next 3 to 5 days for reevaluation and return to the emergency department for any new or worsening symptoms in the meantime.

## 2018-04-07 NOTE — ED Notes (Addendum)
Pt took own Tylenol 500 mg-2 tabs at this time

## 2018-04-07 NOTE — ED Triage Notes (Signed)
Pt c/o bilateral ear pain and difficulty hearing, pt dx with ear infection and given amoxicillin, pt states pain and congestion worsening, pt unable to take decongestant due to being [redacted] weeks pregnant.

## 2018-04-07 NOTE — ED Provider Notes (Signed)
[facility]-EMERGENCY DEPT Provider Note   CSN: [id number] Arrival date & time: 04/07/18  1229     History   Chief Complaint Chief Complaint  Patient presents with  . Otalgia    HPI Angie Sanchez is a 32 y.o. female.  HPI  Pt is a 32 y/o female that is [redacted] weeks pregnant who presents to the ED today c/o rhinorrhea, nasal congestion, bilat otalgia, and postnasal drip. She also reports a mild cough that seems to be present more at night when she has more postnasal drip.  It is constant and worsened since onset.  She denies fevers, chills, difficulty breathy or chest pain.  Was seen in the ED on 04/04/18 and given rx for amoxicillin. States she is on day 4 of amoxicillin and congestion and ear pain have not improved. Has also tried tylenol, benadryl, and flonase with no relief.   Past Medical History:  Diagnosis Date  . H/O varicella   . Headache(784.0)   . Tinnitus    since 2015  . Urinary tract infection     Patient Active Problem List   Diagnosis Date Noted  . Positive GBS test 01/08/2015  . LGA (large for gestational age) fetus 01/08/2015  . Polyhydramnios in third trimester 01/08/2015  . Rh negative, maternal 01/08/2015  . Vaginal delivery 01/08/2015    Past Surgical History:  Procedure Laterality Date  . nodule removal     from knee fingers and toes  . OTHER SURGICAL HISTORY  2001-2002   foot, knee, and finger (granulomas)  . WISDOM TOOTH EXTRACTION       OB History    Gravida  4   Para  3   Term  3   Preterm      AB      Living  3     SAB      TAB      Ectopic      Multiple  0   Live Births  2            Home Medications    Prior to Admission medications   Medication Sig Start Date End Date Taking? Authorizing Provider  amoxicillin (AMOXIL) 500 MG capsule Take 1 capsule (500 mg total) by mouth 3 (three) times daily. 04/04/18   Terrilee Files, MD  amoxicillin-clavulanate (AUGMENTIN) 875-125 MG tablet Take  1 tablet by mouth every 12 (twelve) hours for 7 days. 04/07/18 04/14/18  Jahziah Simonin S, PA-C  calcium carbonate (TUMS - DOSED IN MG ELEMENTAL CALCIUM) 500 MG chewable tablet Chew 1 tablet by mouth daily.    [provider]  ibuprofen (ADVIL,MOTRIN) 600 MG tablet Take 1 tablet (600 mg total) by mouth every 6 (six) hours as needed. 01/10/15   Nigel Bridgeman, CNM  loratadine (CLARITIN) 10 MG tablet Take 10 mg by mouth daily as needed for allergies.     [provider]  oxymetazoline (AFRIN NASAL SPRAY) 0.05 % nasal spray Place 1 spray into both nostrils 2 (two) times daily for 3 days. DO NOT USE THIS MEDICATION FOR MORE THAN 3 DAYS. 04/07/18 04/10/18  Cason Luffman S, PA-C  Prenatal Vit-Fe Fumarate-FA (PRENATAL MULTIVITAMIN) TABS tablet Take 1 tablet by mouth daily at 12 noon.    [provider]    Family History Family History  Problem Relation Age of Onset  . Arthritis Maternal Grandmother   . Hypertension Maternal Grandmother   . Arthritis Maternal Grandfather   . Diabetes Maternal Grandfather   .  Heart disease Maternal Grandfather   . Hypertension Maternal Grandfather   . Depression Maternal Grandfather   . Hypertension Mother   . Depression Mother   . Hyperlipidemia Father   . Depression Father   . Cancer Cousin        breast  . Hypertension Paternal Grandmother   . Parkinson's disease Paternal Grandmother     Social History Social History   Tobacco Use  . Smoking status: Never Smoker  . Smokeless tobacco: Never Used  Substance Use Topics  . Alcohol use: No    Comment: rarely  . Drug use: No     Allergies   Patient has no known allergies.   Review of Systems Review of Systems  Constitutional: Negative for chills and fever.  HENT: Positive for congestion, ear pain, postnasal drip, rhinorrhea and sore throat.   Eyes: Negative for visual disturbance.  Respiratory: Positive for cough (mild). Negative for shortness of breath.   Cardiovascular:  Negative for chest pain.  Gastrointestinal: Negative for abdominal pain, constipation, diarrhea, nausea and vomiting.  Genitourinary: Negative for flank pain.  Musculoskeletal: Negative for back pain.  Skin: Negative for wound.  Neurological: Negative for headaches.     Physical Exam Updated Vital Signs BP 137/90 (BP Location: Left Arm)   Pulse 91   Temp 98.8 F (37.1 C) (Oral)   Resp 16   Ht 5\' 2"  (1.575 m)   Wt 76.3 kg   LMP 11/10/2017 Comment: Patient shield  SpO2 100%   BMI 30.77 kg/m   Physical Exam  Constitutional: She appears well-developed and well-nourished. No distress.  HENT:  Head: Normocephalic and atraumatic.  No pharyngeal erythema.  No tonsillar swelling or exudates.  Bilateral TMs are fluid-filled and bulging.  Right TM is erythematous.  Left TM is not erythematous.  Nasal turbinates are swollen bilaterally, with more so on the right.  Eyes: Pupils are equal, round, and reactive to light. Conjunctivae and EOM are normal.  Neck: Normal range of motion. Neck supple.  Cardiovascular: Normal rate, regular rhythm and normal heart sounds.  No murmur heard. Pulmonary/Chest: Effort normal and breath sounds normal. No stridor. No respiratory distress. She has no wheezes. She has no rales.  Abdominal: Soft. Bowel sounds are normal. There is no tenderness.  Musculoskeletal: She exhibits no edema.  Lymphadenopathy:    She has cervical adenopathy.  Neurological: She is alert.  Skin: Skin is warm and dry.  Psychiatric:  Anxious, tearful  Nursing note and vitals reviewed.  ED Treatments / Results  Labs (all labs ordered are listed, but only abnormal results are displayed) Labs Reviewed - No data to display  EKG None  Radiology No results found.  Procedures Procedures (including critical care time)  Medications Ordered in ED Medications - No data to display   Initial Impression / Assessment and Plan / ED Course  I have reviewed the triage vital signs and  the nursing notes.  Pertinent labs & imaging results that were available during my care of the patient were reviewed by me and considered in my medical decision making (see chart for details).   CONSULT with pharmacist, Weston Brass, who stated that the patient may safely use afrin as long as the patient does not use this more than 3 days.  He also agreed with changing the patient's amoxicillin to Augmentin.   Final Clinical Impressions(s) / ED Diagnoses   Final diagnoses:  Sinusitis, unspecified chronicity, unspecified location   Patient presenting with persistent bilateral otalgia, nasal congestion, postnasal  drip.  Was seen in the ED several days ago and started on amoxicillin and has been taking Flonase, Tylenol, Benadryl with no significant relief at home.  No fevers or chills.  Nontoxic and nonseptic appearing though does appear anxious on exam and is tearful.  Discussed with pharmacy who stated that Afrin can be used for nasal decongestion as long as patient does not use for more than 3 days.  Also agreed with changing amoxicillin to Augmentin.  Discussed plan with patient advised her to follow-up with her doctor the next 3 to 5 days for reevaluation and to return to the ER for any new or worsening symptoms in the meantime.  Patient voices understanding the plan reasons to return to the ED.  All questions answered.  ED Discharge Orders         Ordered    oxymetazoline [facility] NASAL SPRAY) 0.05 % nasal spray  2 times daily     04/07/18 1441    amoxicillin-clavulanate (AUGMENTIN) 875-125 MG tablet  Every 12 hours     04/07/18 1441           Dreamer Carillo S, PA-C 04/07/18 1507    Tilden Fossa, MD 04/09/18 1708

## 2018-07-31 NOTE — L&D Delivery Note (Signed)
Delivery Note At  a viable female was delivered via  (Presentation:LOA ;  ).  APGAR:8 ,9 ; weight pending  .   Placenta status: Trailing membranes , .3V  Cord:  with the following complications: .  NoneCord pH: N/A  Anesthesia:  None Episiotomy:  None Lacerations:  None Suture Repair: N/A Est. Blood Loss (mL):    Mom to postpartum.  Baby to Couplet care / Skin to Skin.  Henderson Newcomer Angie Sanchez 08/24/2018, 9:35 AM

## 2018-08-14 ENCOUNTER — Inpatient Hospital Stay (HOSPITAL_COMMUNITY)
Admission: AD | Admit: 2018-08-14 | Discharge: 2018-08-14 | Disposition: A | Discharge status: Home / Self Care | Payer: Medicaid Other | Attending: Obstetrics and Gynecology | Admitting: Obstetrics and Gynecology

## 2018-08-14 ENCOUNTER — Encounter (HOSPITAL_COMMUNITY): Payer: Self-pay | Admitting: *Deleted

## 2018-08-14 ENCOUNTER — Other Ambulatory Visit: Payer: Self-pay

## 2018-08-14 DIAGNOSIS — R51 Headache: Secondary | ICD-10-CM

## 2018-08-14 DIAGNOSIS — O26893 Other specified pregnancy related conditions, third trimester: Secondary | ICD-10-CM | POA: Diagnosis not present

## 2018-08-14 DIAGNOSIS — Z3A39 39 weeks gestation of pregnancy: Secondary | ICD-10-CM | POA: Diagnosis not present

## 2018-08-14 DIAGNOSIS — R519 Headache, unspecified: Secondary | ICD-10-CM

## 2018-08-14 HISTORY — DX: Anemia, unspecified: D64.9

## 2018-08-14 LAB — URINALYSIS, ROUTINE W REFLEX MICROSCOPIC
Bilirubin Urine: NEGATIVE
Glucose, UA: NEGATIVE mg/dL
Hgb urine dipstick: NEGATIVE
Ketones, ur: NEGATIVE mg/dL
Leukocytes, UA: NEGATIVE
NITRITE: NEGATIVE
PROTEIN: NEGATIVE mg/dL
Specific Gravity, Urine: 1.018 (ref 1.005–1.030)
pH: 6 (ref 5.0–8.0)

## 2018-08-14 NOTE — MAU Note (Signed)
Increased swelling in hands and feet.  BP slightly elevated when checked at home.  Has had a HA last 3 days.  Minimal relief with Tylenol, then immediately returns.  Denies epigastric pain or visual changes.

## 2018-08-14 NOTE — MAU Provider Note (Signed)
History     CSN: [id number]  Arrival date and time: 08/14/18 1014   None     Chief Complaint  Patient presents with  . Foot Swelling  . hands swollen  . Headache   Angie Sanchez is a 33 year old G4P3003 at 39.4wsk who presents for swelling, headache, and elevated bp.  She reports headache for the last 3 days, but is relieved with tylenol.  She states that once tylenol wears off she rates headache at about 7-8/10.  However, she deneis headache at current reporting taking tylenol xr at 0630.  She states the first day of her headache, she received no relief with tylenol.  She reports taking her blood pressure, but got had "wacky results on each arm."  She states that her concern is in the headaches and how she has been feeling regarding the progression of swelling.  Patient endorses good fetal movement and denies VB and LOF.  She reports pressure with walking and the occassional "Braxton-hicks contraction."  She denies RUQ pain, visual disturbances, and endorses numbness/tingling in her fingers but contributes this to carpal tunnel.       OB History    Gravida  4   Para  3   Term  3   Preterm      AB      Living  3     SAB      TAB      Ectopic      Multiple  0   Live Births  3           Past Medical History:  Diagnosis Date  . Anemia   . H/O varicella   . Headache(784.0)   . Tinnitus    since 2015  . Urinary tract infection     Past Surgical History:  Procedure Laterality Date  . nodule removal     from knee fingers and toes  . OTHER SURGICAL HISTORY  2001-2002   foot, knee, and finger (granulomas)  . WISDOM TOOTH EXTRACTION      Family History  Problem Relation Age of Onset  . Arthritis Maternal Grandmother   . Hypertension Maternal Grandmother   . Arthritis Maternal Grandfather   . Diabetes Maternal Grandfather   . Heart disease Maternal Grandfather   . Hypertension Maternal Grandfather   . Depression Maternal Grandfather   .  Hypertension Mother   . Depression Mother   . Cancer Mother        pancreatic  . Hyperlipidemia Father   . Depression Father   . Hypertension Father   . Cancer Cousin        breast  . Hypertension Paternal Grandmother   . Parkinson's disease Paternal Grandmother     Social History   Tobacco Use  . Smoking status: Never Smoker  . Smokeless tobacco: Never Used  Substance Use Topics  . Alcohol use: Not Currently    Comment: rarely  . Drug use: No    Allergies: No Known Allergies  Medications Prior to Admission  Medication Sig Dispense Refill Last Dose  . acetaminophen (TYLENOL) 500 MG tablet Take 1,000 mg by mouth every 6 (six) hours as needed for headache.   08/14/2018 at Unknown time  . ferrous sulfate 325 (65 FE) MG tablet Take 325 mg by mouth at bedtime.   08/13/2018 at Unknown time  . fluticasone (FLONASE) 50 MCG/ACT nasal spray Place 1 spray into both nostrils 2 (two) times daily.    08/14/2018 at Unknown  time  . loratadine (CLARITIN) 10 MG tablet Take 10 mg by mouth daily as needed for allergies.    08/14/2018 at Unknown time  . Prenatal Vit-Fe Fumarate-FA (PRENATAL MULTIVITAMIN) TABS tablet Take 1 tablet by mouth daily at 12 noon.   08/13/2018 at Unknown time  . amoxicillin (AMOXIL) 500 MG capsule Take 1 capsule (500 mg total) by mouth 3 (three) times daily. (Patient not taking: Reported on 08/14/2018) 30 capsule 0 Not Taking at Unknown time    Review of Systems  Constitutional: Negative for chills and fever.  Respiratory: Negative for shortness of breath.   Gastrointestinal: Negative for nausea and vomiting.  Genitourinary: Negative for vaginal bleeding, vaginal discharge and vaginal pain.  Neurological: Positive for headaches (Prior to arrival; None currently.). Negative for dizziness and light-headedness.   Physical Exam   Blood pressure 124/79, pulse (!) 105, temperature 99.2 F (37.3 C), temperature source Oral, resp. rate 18, last menstrual period 11/10/2017,  unknown if currently breastfeeding.  Vitals:   08/14/18 1053 08/14/18 1054 08/14/18 1115 08/14/18 1145  BP: 124/79 124/79 116/72 126/70  Pulse: (!) 105   92  Resp: 18     Temp: 99.2 F (37.3 C)     TempSrc: Oral        Physical Exam  Constitutional: She is oriented to person, place, and time. She appears well-developed and well-nourished. No distress.  HENT:  Head: Normocephalic and atraumatic.  Eyes: Conjunctivae are normal.  Neck: Normal range of motion.  Cardiovascular: Normal rate, regular rhythm and normal heart sounds.  Respiratory: Effort normal and breath sounds normal.  GI: Soft. Bowel sounds are normal.  Gravid  Genitourinary:    Genitourinary Comments: Deferred   Musculoskeletal: Normal range of motion.        General: Edema (Non-pitting) present.  Neurological: She is alert and oriented to person, place, and time.  Skin: Skin is warm and dry.  Psychiatric: She has a normal mood and affect. Her behavior is normal.   Fetal Assessment 150 bpm, Mod Var, -Decels, +Accels Toco: None graphed or palpated  MAU Course  Procedures Results for orders placed or performed during the hospital encounter of 08/14/18 (from the past 24 hour(s))  Urinalysis, Routine w reflex microscopic     Status: Abnormal   Collection Time: 08/14/18 11:26 AM  Result Value Ref Range   Color, Urine YELLOW YELLOW   APPearance HAZY (A) CLEAR   Specific Gravity, Urine 1.018 1.005 - 1.030   pH 6.0 5.0 - 8.0   Glucose, UA NEGATIVE NEGATIVE mg/dL   Hgb urine dipstick NEGATIVE NEGATIVE   Bilirubin Urine NEGATIVE NEGATIVE   Ketones, ur NEGATIVE NEGATIVE mg/dL   Protein, ur NEGATIVE NEGATIVE mg/dL   Nitrite NEGATIVE NEGATIVE   Leukocytes, UA NEGATIVE NEGATIVE    MDM PE  Lab: UA EFM  Assessment and Plan  IUP at 39.4wks Cat I FT Headache  -Exam findings discussed -Informed that BP today are WNL and no need for further assessment.  -NST Reactive -Will await for urine results  Follow  Up (11:55 AM) -UA returns negative -Discussed no need for further evaluation with normal BP, negative urine, and no HA.  -Labor Precautions discussed -Discussed symptoms concerning for PreEclampsia and availability of BP machines at local pharmacies for proper checking. -Educated on when to be concerned regarding headaches; no relief after treatment or when in conjunction with visual disturbances or sensitivities. -Educated on edema during pregnancy; regular occurrence, worse prior to bedtime, but should resolve with elevation or  in am upon waking. -Patient receptive to education and without questions/concerns -Encouraged to call or return to MAU if symptoms worsen or with the onset of new symptoms. -Keep appt as scheduled with primary obstetrician: Aug 16, 2018 -Discharged to home in good condition  Cherre RobinsJessica L Shyler Holzman MSN, CNM 08/14/2018, 11:05 AM

## 2018-08-14 NOTE — Discharge Instructions (Signed)
How to Take Your Blood Pressure  You can take your blood pressure at home with a machine. You may need to check your blood pressure at home:   To check if you have high blood pressure (hypertension).   To check your blood pressure over time.   To make sure your blood pressure medicine is working.  Supplies needed:  You will need a blood pressure machine, or monitor. You can buy one at a drugstore or online. When choosing one:   Choose one with an arm cuff.   Choose one that wraps around your upper arm. Only one finger should fit between your arm and the cuff.   Do not choose one that measures your blood pressure from your wrist or finger.  Your doctor can suggest a monitor.  How to prepare  Avoid these things for 30 minutes before checking your blood pressure:   Drinking caffeine.   Drinking alcohol.   Eating.   Smoking.   Exercising.  Five minutes before checking your blood pressure:   Pee.   Sit in a dining chair. Avoid sitting in a soft couch or armchair.   Be quiet. Do not talk.  How to take your blood pressure  Follow the instructions that came with your machine. If you have a digital blood pressure monitor, these may be the instructions:  1. Sit up straight.  2. Place your feet on the floor. Do not cross your ankles or legs.  3. Rest your left arm at the level of your heart. You may rest it on a table, desk, or chair.  4. Pull up your shirt sleeve.  5. Wrap the blood pressure cuff around the upper part of your left arm. The cuff should be 1 inch (2.5 cm) above your elbow. It is best to wrap the cuff around bare skin.  6. Fit the cuff snugly around your arm. You should be able to place only one finger between the cuff and your arm.  7. Put the cord inside the groove of your elbow.  8. Press the power button.  9. Sit quietly while the cuff fills with air and loses air.  10. Write down the numbers on the screen.  11. Wait 2-3 minutes and then repeat steps 1-10.  What do the numbers mean?  Two  numbers make up your blood pressure. The first number is called systolic pressure. The second is called diastolic pressure. An example of a blood pressure reading is "120 over 80" (or 120/80).  If you are an adult and do not have a medical condition, use this guide to find out if your blood pressure is normal:  Normal   First number: below 120.   Second number: below 80.  Elevated   First number: 120-129.   Second number: below 80.  Hypertension stage 1   First number: 130-139.   Second number: 80-89.  Hypertension stage 2   First number: 140 or above.   Second number: 90 or above.  Your blood pressure is above normal even if only the top or bottom number is above normal.  Follow these instructions at home:   Check your blood pressure as often as your doctor tells you to.   Take your monitor to your next doctor's appointment. Your doctor will:  ? Make sure you are using it correctly.  ? Make sure it is working right.   Make sure you understand what your blood pressure numbers should be.   Tell your doctor   if your medicines are causing side effects.  Contact a doctor if:   Your blood pressure keeps being high.  Get help right away if:   Your first blood pressure number is higher than 180.   Your second blood pressure number is higher than 120.  This information is not intended to replace advice given to you by your health care provider. Make sure you discuss any questions you have with your health care provider.  Document Released: 06/29/2008 Document Revised: 06/14/2016 Document Reviewed: 12/24/2015  Elsevier Interactive Patient Education  2019 [employer]

## 2018-08-23 ENCOUNTER — Other Ambulatory Visit: Payer: Self-pay | Admitting: Obstetrics and Gynecology

## 2018-08-24 ENCOUNTER — Encounter (HOSPITAL_COMMUNITY): Payer: Self-pay | Admitting: *Deleted

## 2018-08-24 ENCOUNTER — Other Ambulatory Visit: Payer: Self-pay

## 2018-08-24 ENCOUNTER — Inpatient Hospital Stay (HOSPITAL_COMMUNITY)
Admission: AD | Admit: 2018-08-24 | Discharge: 2018-08-26 | DRG: 807 | Disposition: A | Discharge status: Home / Self Care | Payer: Medicaid Other | Attending: Obstetrics and Gynecology | Admitting: Obstetrics and Gynecology

## 2018-08-24 DIAGNOSIS — O3663X0 Maternal care for excessive fetal growth, third trimester, not applicable or unspecified: Secondary | ICD-10-CM | POA: Diagnosis present

## 2018-08-24 DIAGNOSIS — O26893 Other specified pregnancy related conditions, third trimester: Secondary | ICD-10-CM | POA: Diagnosis present

## 2018-08-24 DIAGNOSIS — O480 Post-term pregnancy: Principal | ICD-10-CM | POA: Diagnosis present

## 2018-08-24 DIAGNOSIS — Z6791 Unspecified blood type, Rh negative: Secondary | ICD-10-CM | POA: Diagnosis not present

## 2018-08-24 DIAGNOSIS — O99824 Streptococcus B carrier state complicating childbirth: Secondary | ICD-10-CM | POA: Diagnosis present

## 2018-08-24 DIAGNOSIS — Z3A41 41 weeks gestation of pregnancy: Secondary | ICD-10-CM

## 2018-08-24 LAB — TYPE AND SCREEN
ABO/RH(D): A NEG
Antibody Screen: NEGATIVE

## 2018-08-24 LAB — CBC
HCT: 39.2 % (ref 36.0–46.0)
HEMOGLOBIN: 12.9 g/dL (ref 12.0–15.0)
MCH: 29.3 pg (ref 26.0–34.0)
MCHC: 32.9 g/dL (ref 30.0–36.0)
MCV: 89.1 fL (ref 80.0–100.0)
Platelets: 181 10*3/uL (ref 150–400)
RBC: 4.4 MIL/uL (ref 3.87–5.11)
RDW: 15.5 % (ref 11.5–15.5)
WBC: 11.6 10*3/uL — ABNORMAL HIGH (ref 4.0–10.5)
nRBC: 0 % (ref 0.0–0.2)

## 2018-08-24 MED ORDER — FENTANYL CITRATE (PF) 100 MCG/2ML IJ SOLN: 50.0000 ug | INTRAMUSCULAR | Status: DC | PRN

## 2018-08-24 MED ORDER — EPHEDRINE 5 MG/ML INJ
10.0000 mg | INTRAVENOUS | Status: DC | PRN
  Filled 2018-08-24: qty 2

## 2018-08-24 MED ORDER — PENICILLIN G 3 MILLION UNITS IVPB - SIMPLE MED: 3.0000 10*6.[IU] | INTRAVENOUS | Status: DC

## 2018-08-24 MED ORDER — OXYTOCIN 40 UNITS IN NORMAL SALINE INFUSION - SIMPLE MED
2.5000 [IU]/h | INTRAVENOUS | Status: DC
  Administered 2018-08-24: 2.5 [IU]/h via INTRAVENOUS
  Filled 2018-08-24: qty 1000

## 2018-08-24 MED ORDER — PHENYLEPHRINE 40 MCG/ML (10ML) SYRINGE FOR IV PUSH (FOR BLOOD PRESSURE SUPPORT)
80.0000 ug | PREFILLED_SYRINGE | INTRAVENOUS | Status: DC | PRN
  Filled 2018-08-24: qty 10

## 2018-08-24 MED ORDER — OXYCODONE-ACETAMINOPHEN 5-325 MG PO TABS: 2.0000 | ORAL_TABLET | ORAL | Status: DC | PRN

## 2018-08-24 MED ORDER — DIPHENHYDRAMINE HCL 50 MG/ML IJ SOLN: 12.5000 mg | INTRAMUSCULAR | Status: DC | PRN

## 2018-08-24 MED ORDER — LACTATED RINGERS IV SOLN
INTRAVENOUS | Status: DC
  Administered 2018-08-24: 08:00:00 via INTRAVENOUS

## 2018-08-24 MED ORDER — LIDOCAINE HCL (PF) 1 % IJ SOLN
30.0000 mL | INTRAMUSCULAR | Status: DC | PRN
  Filled 2018-08-24: qty 30

## 2018-08-24 MED ORDER — SODIUM CHLORIDE 0.9 % IV SOLN
5.0000 10*6.[IU] | Freq: Once | INTRAVENOUS | Status: DC
  Filled 2018-08-24: qty 5

## 2018-08-24 MED ORDER — SODIUM CHLORIDE 0.9 % IV SOLN
2.0000 g | Freq: Once | INTRAVENOUS | Status: AC
  Administered 2018-08-24: 2 g via INTRAVENOUS
  Filled 2018-08-24: qty 2

## 2018-08-24 MED ORDER — DIPHENHYDRAMINE HCL 25 MG PO CAPS: 25.0000 mg | ORAL_CAPSULE | Freq: Four times a day (QID) | ORAL | Status: DC | PRN

## 2018-08-24 MED ORDER — TERBUTALINE SULFATE 1 MG/ML IJ SOLN
0.2500 mg | Freq: Once | INTRAMUSCULAR | Status: DC | PRN
  Filled 2018-08-24: qty 1

## 2018-08-24 MED ORDER — ACETAMINOPHEN 325 MG PO TABS: 650.0000 mg | ORAL_TABLET | ORAL | Status: DC | PRN

## 2018-08-24 MED ORDER — ONDANSETRON HCL 4 MG PO TABS: 4.0000 mg | ORAL_TABLET | ORAL | Status: DC | PRN

## 2018-08-24 MED ORDER — PHENYLEPHRINE 40 MCG/ML (10ML) SYRINGE FOR IV PUSH (FOR BLOOD PRESSURE SUPPORT)
80.0000 ug | PREFILLED_SYRINGE | INTRAVENOUS | Status: DC | PRN
  Filled 2018-08-24 (×2): qty 10

## 2018-08-24 MED ORDER — IBUPROFEN 600 MG PO TABS
600.0000 mg | ORAL_TABLET | Freq: Four times a day (QID) | ORAL | Status: DC
  Administered 2018-08-24 – 2018-08-26 (×8): 600 mg via ORAL
  Filled 2018-08-24 (×9): qty 1

## 2018-08-24 MED ORDER — OXYCODONE-ACETAMINOPHEN 5-325 MG PO TABS: 1.0000 | ORAL_TABLET | ORAL | Status: DC | PRN

## 2018-08-24 MED ORDER — WITCH HAZEL-GLYCERIN EX PADS: 1.0000 "application " | MEDICATED_PAD | CUTANEOUS | Status: DC | PRN

## 2018-08-24 MED ORDER — SOD CITRATE-CITRIC ACID 500-334 MG/5ML PO SOLN: 30.0000 mL | ORAL | Status: DC | PRN

## 2018-08-24 MED ORDER — ZOLPIDEM TARTRATE 5 MG PO TABS: 5.0000 mg | ORAL_TABLET | Freq: Every evening | ORAL | Status: DC | PRN

## 2018-08-24 MED ORDER — DIBUCAINE 1 % RE OINT: 1.0000 "application " | TOPICAL_OINTMENT | RECTAL | Status: DC | PRN

## 2018-08-24 MED ORDER — TETANUS-DIPHTH-ACELL PERTUSSIS 5-2.5-18.5 LF-MCG/0.5 IM SUSP: 0.5000 mL | Freq: Once | INTRAMUSCULAR | Status: DC

## 2018-08-24 MED ORDER — OXYTOCIN BOLUS FROM INFUSION
500.0000 mL | Freq: Once | INTRAVENOUS | Status: AC
  Administered 2018-08-24: 500 mL via INTRAVENOUS

## 2018-08-24 MED ORDER — LACTATED RINGERS IV SOLN: 500.0000 mL | INTRAVENOUS | Status: DC | PRN

## 2018-08-24 MED ORDER — FENTANYL CITRATE (PF) 100 MCG/2ML IJ SOLN
50.0000 ug | INTRAMUSCULAR | Status: DC | PRN
  Administered 2018-08-24: 100 ug via INTRAVENOUS
  Filled 2018-08-24: qty 2

## 2018-08-24 MED ORDER — FENTANYL 2.5 MCG/ML BUPIVACAINE 1/10 % EPIDURAL INFUSION (WH - ANES)
14.0000 mL/h | INTRAMUSCULAR | Status: DC | PRN
  Filled 2018-08-24: qty 100

## 2018-08-24 MED ORDER — BENZOCAINE-MENTHOL 20-0.5 % EX AERO
1.0000 "application " | INHALATION_SPRAY | CUTANEOUS | Status: DC | PRN
  Filled 2018-08-24: qty 56

## 2018-08-24 MED ORDER — OXYTOCIN 40 UNITS IN NORMAL SALINE INFUSION - SIMPLE MED: 2.5000 [IU]/h | INTRAVENOUS | Status: DC

## 2018-08-24 MED ORDER — ONDANSETRON HCL 4 MG/2ML IJ SOLN: 4.0000 mg | Freq: Four times a day (QID) | INTRAMUSCULAR | Status: DC | PRN

## 2018-08-24 MED ORDER — ONDANSETRON HCL 4 MG/2ML IJ SOLN: 4.0000 mg | INTRAMUSCULAR | Status: DC | PRN

## 2018-08-24 MED ORDER — SENNOSIDES-DOCUSATE SODIUM 8.6-50 MG PO TABS
2.0000 | ORAL_TABLET | ORAL | Status: DC
  Administered 2018-08-25 (×2): 2 via ORAL
  Filled 2018-08-24 (×2): qty 2

## 2018-08-24 MED ORDER — LACTATED RINGERS IV SOLN: 500.0000 mL | Freq: Once | INTRAVENOUS | Status: DC

## 2018-08-24 MED ORDER — LIDOCAINE HCL (PF) 1 % IJ SOLN: 30.0000 mL | INTRAMUSCULAR | Status: DC | PRN

## 2018-08-24 MED ORDER — FLEET ENEMA 7-19 GM/118ML RE ENEM: 1.0000 | ENEMA | RECTAL | Status: DC | PRN

## 2018-08-24 MED ORDER — PRENATAL MULTIVITAMIN CH
1.0000 | ORAL_TABLET | Freq: Every day | ORAL | Status: DC
  Administered 2018-08-25: 1 via ORAL
  Filled 2018-08-24: qty 1

## 2018-08-24 MED ORDER — SIMETHICONE 80 MG PO CHEW: 80.0000 mg | CHEWABLE_TABLET | ORAL | Status: DC | PRN

## 2018-08-24 MED ORDER — SODIUM CHLORIDE 0.9 % IV SOLN: 5.0000 10*6.[IU] | Freq: Once | INTRAVENOUS | Status: DC

## 2018-08-24 MED ORDER — OXYTOCIN BOLUS FROM INFUSION: 500.0000 mL | Freq: Once | INTRAVENOUS | Status: DC

## 2018-08-24 MED ORDER — COCONUT OIL OIL: 1.0000 "application " | TOPICAL_OIL | Status: DC | PRN

## 2018-08-24 MED ORDER — LACTATED RINGERS IV SOLN: INTRAVENOUS | Status: DC

## 2018-08-24 NOTE — Progress Notes (Signed)
I just got a call from MAU about pt, pt made cervical change, I placed admit orders and reported off to [facility] [place] to assess and perform H&P, pt to be transferred to LD.

## 2018-08-24 NOTE — Lactation Note (Signed)
This note was copied from a baby's chart. Lactation Consultation Note  Patient Name: Angie Sanchez [id number] Date: 08/24/2018 Reason for consult: Initial assessment;Term  P4 mother whose infant is now 79 hours old.  Mother has three other children (ages 81, 73, 3) whom she breast fed  Baby was swaddled and sleeping when I arrived.  Mother had no questions/concerns related to breast feeding.  She had no issues with her other three children related to breast feeding and stated that this baby has latched well since birth.  Encouraged to feed 8-12 times/24 hours or sooner if baby shows cues.  Mother will continue hand expression before/after feedings to help increase milk supply.  She will call as needed for latch assistance.    Mom made aware of O/P services, breastfeeding support groups, community resources, and our phone # for post-discharge questions. Father present.   Maternal Data Formula Feeding for Exclusion: No Has patient been taught Hand Expression?: Yes Does the patient have breastfeeding experience prior to this delivery?: Yes  Feeding    LATCH Score                   Interventions    Lactation Tools Discussed/Used     Consult Status Consult Status: Follow-up Date: 08/25/18 Follow-up type: In-patient    Nithya Meriweather R Cordarrell Sane 08/24/2018, 5:05 PM

## 2018-08-24 NOTE — H&P (Signed)
Angie MeigsLaura E Sanchez is a 33 y.o. female, G4P3003 at 6841 weeks, presenting for labor. Denies leakage of fluid.  FM+.  Prenatal hx remarkable for ten pound infant in past with no problems.  RH negative, FOB RH negative, GBS positive .  Patient Active Problem List   Diagnosis Date Noted  . Normal labor and delivery 08/24/2018  . Post-dates pregnancy 08/24/2018  . Positive GBS test 01/08/2015  . LGA (large for gestational age) fetus 01/08/2015  . Polyhydramnios in third trimester 01/08/2015  . Rh negative, maternal 01/08/2015  . Vaginal delivery 01/08/2015    History of present pregnancy: Patient entered care at 14  weeks.   EDC of 08/17/2018 was established by LMP.   Anatomy scan: 19.4 weeks, with normal findings and an anterior placenta.   Additional US evaluations:  At 31 weeks, AFI 20.4 Significant prenatal events:    Last evaluation: Last week  OB History    Gravida  4   Para  3   Term  3   Preterm      AB      Living  3     SAB      TAB      Ectopic      Multiple  0   Live Births  3          Past Medical History:  Diagnosis Date  . Anemia   . H/O varicella   . Headache(784.0)   . Tinnitus    since 2015  . Urinary tract infection    Past Surgical History:  Procedure Laterality Date  . nodule removal     from knee fingers and toes  . OTHER SURGICAL HISTORY  2001-2002   foot, knee, and finger (granulomas)  . WISDOM TOOTH EXTRACTION     Family History: family history includes Arthritis in her maternal grandfather and maternal grandmother; Cancer in her cousin and mother; Depression in her father, maternal grandfather, and mother; Diabetes in her maternal grandfather; Heart disease in her maternal grandfather; Hyperlipidemia in her father; Hypertension in her father, maternal grandfather, maternal grandmother, mother, and paternal grandmother; Parkinson's disease in her paternal grandmother. Social History:  reports that she has never smoked. She has never  used smokeless tobacco. She reports previous alcohol use. She reports that she does not use drugs.   Prenatal Transfer Tool  Maternal Diabetes: No Genetic Screening: Declined Maternal Ultrasounds/Referrals: Normal Fetal Ultrasounds or other Referrals:  None Maternal Substance Abuse:  No Significant Maternal Medications:  None Significant Maternal Lab Results: None  TDAP Declined Flu Declined  ROS:All 10 systems reviewed and negative except as stated above.  No Known Allergies   Dilation: 5.5 Effacement (%): 100 Station: -1 Exam by:: Bernerd PhoNancy Prothero, CNM  Blood pressure 123/60, pulse (!) 105, temperature 97.9 F (36.6 C), temperature source Oral, resp. rate 18, last menstrual period 11/10/2017, SpO2 100 %, unknown if currently breastfeeding.  Chest clear Heart RRR without murmur Abd gravid, NT, FH LGA Pelvic: Proven 10 pounds Ext: Neg  FHR: Category 1 FHT 150 accels, no decels UCs:  3 in 10 minutes  Prenatal labs: ABO, Rh:  A Neg Antibody:  Neg Rubella:   Imm RPR:   NR HBsAg:   NR HIV:   NR GBS:  Positive Sickle cell/Hgb electrophoresis:  AA Pap:  wnl GC:  Neg Chlamydia:  Neg Genetic screenings:  Declined Glucola:  76 Other:   Hgb 10.3 at 28 weeks       Assessment/Plan: IUP at 41  in active labor Cat 1 strip  Plan: Admit to Birthing Suite  Routine [facility] orders Pain med/epidural prn PCN G for GBS prophylaxis with Ampicillin Pt desires NCB  Henderson Newcomer ProtheroCNM, MSN 08/24/2018, 8:14 AM

## 2018-08-24 NOTE — MAU Note (Signed)
Pt presents to MAU c/o ctxs every . No LOF or bleeding. +FM.

## 2018-08-25 ENCOUNTER — Inpatient Hospital Stay (HOSPITAL_COMMUNITY): Payer: Medicaid Other

## 2018-08-25 ENCOUNTER — Encounter (HOSPITAL_COMMUNITY): Payer: Self-pay | Admitting: *Deleted

## 2018-08-25 LAB — CBC
HCT: 33 % — ABNORMAL LOW (ref 36.0–46.0)
Hemoglobin: 10.8 g/dL — ABNORMAL LOW (ref 12.0–15.0)
MCH: 29.3 pg (ref 26.0–34.0)
MCHC: 32.7 g/dL (ref 30.0–36.0)
MCV: 89.4 fL (ref 80.0–100.0)
PLATELETS: 146 10*3/uL — AB (ref 150–400)
RBC: 3.69 MIL/uL — ABNORMAL LOW (ref 3.87–5.11)
RDW: 15.6 % — ABNORMAL HIGH (ref 11.5–15.5)
WBC: 9.9 10*3/uL (ref 4.0–10.5)
nRBC: 0 % (ref 0.0–0.2)

## 2018-08-25 LAB — RPR: RPR: NONREACTIVE

## 2018-08-25 NOTE — Progress Notes (Signed)
Subjective: Postpartum Day # 1 : S/P NSVD due to spontaneous active labor. Patient up ad lib, denies syncope or dizziness. Reports consuming regular diet without issues and denies N/V. Patient reports 0 bowel movement + passing flatus.  Denies issues with urination and reports bleeding is "lighter."  Patient is breastfeeding and reports going well.  Desires condoms for postpartum contraception.  Pain is being appropriately managed with use of po meds.   No laceration Feeding:  breast Contraceptive plan:  Condoms Baby Female  Objective: Vital signs in last 24 hours: Patient Vitals for the past 24 hrs:  BP Temp Temp src Pulse Resp SpO2  08/25/18 0557 117/71 (!) 97.5 F (36.4 C) Oral 72 18 96 %  08/25/18 0130 (!) 112/57 97.8 F (36.6 C) Oral 72 18 96 %  08/24/18 2125 110/69 98.1 F (36.7 C) Oral 86 16 96 %  08/24/18 1730 112/65 98.4 F (36.9 C) Oral 81 16 -  08/24/18 1220 111/68 98.4 F (36.9 C) Oral 91 18 -  08/24/18 1120 (!) 107/53 98 F (36.7 C) Oral 82 16 -  08/24/18 1015 127/60 97.8 F (36.6 C) Oral 87 18 -  08/24/18 1000 119/65 - - 95 16 -  08/24/18 0945 118/66 - - 86 18 -  08/24/18 0930 115/72 - - (!) 107 18 -  08/24/18 0920 (!) 124/55 97.9 F (36.6 C) Oral (!) 115 20 -     Physical Exam:  General: alert, cooperative, appears stated age and no distress Mood/Affect: happy Lungs: clear to auscultation, no wheezes, rales or rhonchi, symmetric air entry.  Heart: normal rate, regular rhythm, normal S1, S2, no murmurs, rubs, clicks or gallops. Breast: breasts appear normal, no suspicious masses, no skin or nipple changes or axillary nodes. Abdomen:  + bowel sounds, soft, non-tender GU: perineum Intact, healing well. No signs of external hematomas.  Uterine Fundus: firm Lochia: appropriate Skin: Warm, Dry. DVT Evaluation: No evidence of DVT seen on physical exam. Negative Homan's sign. No cords or calf tenderness. Generalized edema noted in hand, legs.   CBC Latest Ref  Rng & Units 08/25/2018 08/24/2018 01/22/2018  WBC 4.0 - 10.5 K/uL 9.9 11.6(H) 8.4  Hemoglobin 12.0 - 15.0 g/dL 10.8(L) 12.9 12.8  Hematocrit 36.0 - 46.0 % 33.0(L) 39.2 37.3  Platelets 150 - 400 K/uL 146(L) 181 208    Results for orders placed or performed during the hospital encounter of 08/24/18 (from the past 24 hour(s))  CBC     Status: Abnormal   Collection Time: 08/25/18  6:41 AM  Result Value Ref Range   WBC 9.9 4.0 - 10.5 K/uL   RBC 3.69 (L) 3.87 - 5.11 MIL/uL   Hemoglobin 10.8 (L) 12.0 - 15.0 g/dL   HCT 07.8 (L) 67.5 - 44.9 %   MCV 89.4 80.0 - 100.0 fL   MCH 29.3 26.0 - 34.0 pg   MCHC 32.7 30.0 - 36.0 g/dL   RDW 20.1 (H) 00.7 - 12.1 %   Platelets 146 (L) 150 - 400 K/uL   nRBC 0.0 0.0 - 0.2 %     CBG (last 3)  No results for input(s): GLUCAP in the last 72 hours.   I/O last 3 completed shifts: In: -  Out: 556 [Blood:556]   Assessment Postpartum Day # 1 : S/P NSVD due to spontaneous labor and delivery. Pt stable. -2 involution. breastfeeding. Hemodynamically stable, hgn drop from 12.9-10.8.   Plan: Continue other mgmt as ordered VTE prophylactics: Early ambulated as tolerates.  Pain control:  Motrin/Tylenol PRN Education given regarding options for contraception, including barrier methods, injectable contraception, IUD placement, oral contraceptives.  Plan for discharge tomorrow, Breastfeeding, Lactation consult and Contraception Condums   Dr. Dion Body to be updated on patient status  [facility] NP-C, CNM 08/25/2018, 9:07 AM

## 2018-08-25 NOTE — Progress Notes (Signed)
At shift report (2300 last evening) it was reported to this nurse that the dad had appeared "very controlling" to the mother.  His behavior was appropriate all evening, and he was very attentive to mother and baby.

## 2018-08-26 MED ORDER — IBUPROFEN 600 MG PO TABS: 600.0000 mg | ORAL_TABLET | Freq: Four times a day (QID) | ORAL | 0 refills | Status: AC | PRN

## 2018-08-26 NOTE — Discharge Summary (Signed)
OB Discharge Summary     Patient Name: Angie Sanchez DOB: 08-29-1985 MRN: [id number]  Date of admission: 08/24/2018 Delivering MD: Kenney Houseman   Date of discharge: 08/26/2018  Admitting diagnosis: 41wks, contractions Intrauterine pregnancy: 101w0d     Secondary diagnosis:  Active Problems:   SVD (spontaneous vaginal delivery)   Normal labor and delivery   Post-dates pregnancy    Discharge diagnosis: Term Pregnancy Delivered                                                                                                Post partum procedures:n/a  Augmentation: AROM  Complications: None  Hospital course:  Onset of Labor With Vaginal Delivery     33 y.o. yo [id number] at [redacted]w[redacted]d was admitted in Active Labor on 08/24/2018. Patient had an uncomplicated labor course as follows:  Membrane Rupture Time/Date: 8:45 AM ,08/24/2018   Intrapartum Procedures: Episiotomy: None [1]                                         Lacerations:  None [1]  Patient had a delivery of a Viable infant. 08/24/2018  Information for the patient's newborn:  Corabella, Schwitzer [[id number]]  Delivery Method: Vag-Spont    Pateint had an uncomplicated postpartum course.  She is ambulating, tolerating a regular diet, passing flatus, and urinating well. Patient is discharged home in stable condition on 08/26/18.   Physical exam  Vitals:   08/25/18 0557 08/25/18 1435 08/25/18 2142 08/26/18 0537  BP: 117/71 123/69 118/77 115/75  Pulse: 72 89 82 89  Resp: 18 16  18   Temp: (!) 97.5 F (36.4 C) (!) 97.5 F (36.4 C) 98.9 F (37.2 C) 97.9 F (36.6 C)  TempSrc: Oral Oral Oral Oral  SpO2: 96%  99% 98%  Height:       General: alert, cooperative and no distress Lochia: appropriate Uterine Fundus: firm Incision: N/A DVT Evaluation: No evidence of DVT seen on physical exam. Negative Homan's sign. No cords or calf tenderness. No significant calf/ankle edema. Labs: Lab Results  Component Value Date   WBC 9.9 08/25/2018   HGB 10.8 (L) 08/25/2018   HCT 33.0 (L) 08/25/2018   MCV 89.4 08/25/2018   PLT 146 (L) 08/25/2018   CMP Latest Ref Rng & Units 04/17/2013  Glucose 70 - 99 mg/dL 76  BUN 6 - 23 mg/dL 17  Creatinine 0.4 - 1.2 mg/dL 0.6  Sodium 403 - 474 mEq/L 137  Potassium 3.5 - 5.1 mEq/L 4.1  Chloride 96 - 112 mEq/L 107  CO2 19 - 32 mEq/L 26  Calcium 8.4 - 10.5 mg/dL 9.2  Total Protein 6.0 - 8.3 g/dL -  Total Bilirubin 0.3 - 1.2 mg/dL -  Alkaline Phos 39 - 259 U/L -  AST 0 - 37 U/L -  ALT 0 - 35 U/L -    Discharge instruction: per After Visit Summary and "Baby and Me Booklet".  After visit meds:  Allergies as of 08/26/2018  No Known Allergies     Medication List    STOP taking these medications   ferrous sulfate 325 (65 FE) MG tablet     TAKE these medications   acetaminophen 500 MG tablet Commonly known as:  TYLENOL Take 1,000 mg by mouth every 6 (six) hours as needed for headache.   fluticasone 50 MCG/ACT nasal spray Commonly known as:  FLONASE Place 1 spray into both nostrils 2 (two) times daily.   ibuprofen 600 MG tablet Commonly known as:  ADVIL,MOTRIN Take 1 tablet (600 mg total) by mouth every 6 (six) hours as needed for moderate pain.   loratadine 10 MG tablet Commonly known as:  CLARITIN Take 10 mg by mouth daily as needed for allergies.   prenatal multivitamin Tabs tablet Take 1 tablet by mouth daily at 12 noon.       Diet: routine diet  Activity: Advance as tolerated. Pelvic rest for 6 weeks.   Outpatient follow up:6 weeks Follow up Appt:No future appointments. Follow up Visit:No follow-ups on file.  Postpartum contraception: Condoms  Newborn Data: Live born female  Birth Weight: 9 lb 2.2 oz (4145 g) APGAR: 8, 9  Newborn Delivery   Birth date/time:  08/24/2018 09:08:00 Delivery type:  Vaginal, Spontaneous     Baby Feeding: Breast Disposition:home with mother   08/26/2018 Janeece Riggers, CNM

## 2018-08-26 NOTE — Lactation Note (Signed)
This note was copied from a baby's chart. Lactation Consultation Note  Patient Name: Girl Aisia Mccarty [id number] Date: 08/26/2018 Reason for consult: Follow-up assessment;Term  P4 mother whose infant is now 9 hours old.  Mother breast fed all her other children without difficulties.  Baby sleeping when I arrived.  Mother had no questions/concerns related to breast feeding.  She feels like baby is latching well and denies pain with latching.  Her breasts are feeling heavier today.  She experienced engorgement with one of her children and we reviewed engorgement prevention/treatment.    Mother has a manual pump and a DEBP for home use.  She has our OP phone number for questions after discharge.  Father present.  Family is ready for discharge.   Maternal Data Formula Feeding for Exclusion: No Has patient been taught Hand Expression?: Yes Does the patient have breastfeeding experience prior to this delivery?: Yes  Feeding Feeding Type: Breast Fed  LATCH Score                   Interventions    Lactation Tools Discussed/Used     Consult Status Consult Status: Complete Date: 08/26/18 Follow-up type: Call as needed    Norma Montemurro R Maleiyah Releford 08/26/2018, 8:59 AM

## 2018-08-26 NOTE — Discharge Instructions (Signed)
Postpartum Care After Vaginal Delivery ° °The period of time right after you deliver your newborn is called the postpartum period. °What kind of medical care will I receive? °· You may continue to receive fluids and medicines through an IV tube inserted into one of your veins. °· If an incision was made near your vagina (episiotomy) or if you had some vaginal tearing during delivery, cold compresses may be placed on your episiotomy or your tear. This helps to reduce pain and swelling. °· You may be given a squirt bottle to use when you go to the bathroom. You may use this until you are comfortable wiping as usual. To use the squirt bottle, follow these steps: °? Before you urinate, fill the squirt bottle with warm water. Do not use hot water. °? After you urinate, while you are sitting on the toilet, use the squirt bottle to rinse the area around your urethra and vaginal opening. This rinses away any urine and blood. °? You may do this instead of wiping. As you start healing, you may use the squirt bottle before wiping yourself. Make sure to wipe gently. °? Fill the squirt bottle with clean water every time you use the bathroom. °· You will be given sanitary pads to wear. °How can I expect to feel? °· You may not feel the need to urinate for several hours after delivery. °· You will have some soreness and pain in your abdomen and vagina. °· If you are breastfeeding, you may have uterine contractions every time you breastfeed for up to several weeks postpartum. Uterine contractions help your uterus return to its normal size. °· It is normal to have vaginal bleeding (lochia) after delivery. The amount and appearance of lochia is often similar to a menstrual period in the first week after delivery. It will gradually decrease over the next few weeks to a dry, yellow-brown discharge. For most women, lochia stops completely by 6-8 weeks after delivery. Vaginal bleeding can vary from woman to woman. °· Within the first few  days after delivery, you may have breast engorgement. This is when your breasts feel heavy, full, and uncomfortable. Your breasts may also throb and feel hard, tightly stretched, warm, and tender. After this occurs, you may have milk leaking from your breasts. Your health care provider can help you relieve discomfort due to breast engorgement. Breast engorgement should go away within a few days. °· You may feel more sad or worried than normal due to hormonal changes after delivery. These feelings should not last more than a few days. If these feelings do not go away after several days, speak with your health care provider. °How should I care for myself? °· Tell your health care provider if you have pain or discomfort. °· Drink enough water to keep your urine clear or pale yellow. °· Wash your hands thoroughly with soap and water for at least 20 seconds after changing your sanitary pads, after using the toilet, and before holding or feeding your baby. °· If you are not breastfeeding, avoid touching your breasts a lot. Doing this can make your breasts produce more milk. °· If you become weak or lightheaded, or you feel like you might faint, ask for help before: °? Getting out of bed. °? Showering. °· Change your sanitary pads frequently. Watch for any changes in your flow, such as a sudden increase in volume, a change in color, the passing of large blood clots. If you pass a blood clot from your vagina,   save it to show to your health care provider. Do not flush blood clots down the toilet without having your health care provider look at them. °· Make sure that all your vaccinations are up to date. This can help protect you and your baby from getting certain diseases. You may need to have immunizations done before you leave the hospital. °· If desired, talk with your health care provider about methods of family planning or birth control (contraception). °How can I start bonding with my baby? °Spending as much time as  possible with your baby is very important. During this time, you and your baby can get to know each other and develop a bond. Having your baby stay with you in your room (rooming in) can give you time to get to know your baby. Rooming in can also help you become comfortable caring for your baby. Breastfeeding can also help you bond with your baby. °How can I plan for returning home with my baby? °· Make sure that you have a car seat installed in your vehicle. °? Your car seat should be checked by a certified car seat installer to make sure that it is installed safely. °? Make sure that your baby fits into the car seat safely. °· Ask your health care provider any questions you have about caring for yourself or your baby. Make sure that you are able to contact your health care provider with any questions after leaving the hospital. °This information is not intended to replace advice given to you by your health care provider. Make sure you discuss any questions you have with your health care provider. °Document Released: 05/14/2007 Document Revised: 12/20/2015 Document Reviewed: 06/21/2015 °Elsevier Interactive Patient Education © 2018 [employer] ° ° °Postpartum Depression and Baby Blues °The postpartum period begins right after the birth of a baby. During this time, there is often a great amount of joy and excitement. It is also a time of many changes in the life of the parents. Regardless of how many times a mother gives birth, each child brings new challenges and dynamics to the family. It is not unusual to have feelings of excitement along with confusing shifts in moods, emotions, and thoughts. All mothers are at risk of developing postpartum depression or the "baby blues." These mood changes can occur right after giving birth, or they may occur many months after giving birth. The baby blues or postpartum depression can be mild or severe. Additionally, postpartum depression can go away rather quickly, or it can  be a long-term condition. °What are the causes? °Raised hormone levels and the rapid drop in those levels are thought to be a main cause of postpartum depression and the baby blues. A number of hormones change during and after pregnancy. Estrogen and progesterone usually decrease right after the delivery of your baby. The levels of thyroid hormone and various cortisol steroids also rapidly drop. Other factors that play a role in these mood changes include major life events and genetics. °What increases the risk? °If you have any of the following risks for the baby blues or postpartum depression, know what symptoms to watch out for during the postpartum period. Risk factors that may increase the likelihood of getting the baby blues or postpartum depression include: °· Having a personal or family history of depression. °· Having depression while being pregnant. °· Having premenstrual mood issues or mood issues related to oral contraceptives. °· Having a lot of life stress. °· Having marital conflict. °· Lacking   a social support network. °· Having a baby with special needs. °· Having health problems, such as diabetes. ° °What are the signs or symptoms? °Symptoms of baby blues include: °· Brief changes in mood, such as going from extreme happiness to sadness. °· Decreased concentration. °· Difficulty sleeping. °· Crying spells, tearfulness. °· Irritability. °· Anxiety. ° °Symptoms of postpartum depression typically begin within the first month after giving birth. These symptoms include: °· Difficulty sleeping or excessive sleepiness. °· Marked weight loss. °· Agitation. °· Feelings of worthlessness. °· Lack of interest in activity or food. ° °Postpartum psychosis is a very serious condition and can be dangerous. Fortunately, it is rare. Displaying any of the following symptoms is cause for immediate medical attention. Symptoms of postpartum psychosis include: °· Hallucinations and delusions. °· Bizarre or disorganized  behavior. °· Confusion or disorientation. ° °How is this diagnosed? °A diagnosis is made by an evaluation of your symptoms. There are no medical or lab tests that lead to a diagnosis, but there are various questionnaires that a health care provider may use to identify those with the baby blues, postpartum depression, or psychosis. Often, a screening tool called the Edinburgh Postnatal Depression Scale is used to diagnose depression in the postpartum period. °How is this treated? °The baby blues usually goes away on its own in 1-2 weeks. Social support is often all that is needed. You will be encouraged to get adequate sleep and rest. Occasionally, you may be given medicines to help you sleep. °Postpartum depression requires treatment because it can last several months or longer if it is not treated. Treatment may include individual or group therapy, medicine, or both to address any social, physiological, and psychological factors that may play a role in the depression. Regular exercise, a healthy diet, rest, and social support may also be strongly recommended. °Postpartum psychosis is more serious and needs treatment right away. Hospitalization is often needed. °Follow these instructions at home: °· Get as much rest as you can. Nap when the baby sleeps. °· Exercise regularly. Some women find yoga and walking to be beneficial. °· Eat a balanced and nourishing diet. °· Do little things that you enjoy. Have a cup of tea, take a bubble bath, read your favorite magazine, or listen to your favorite music. °· Avoid alcohol. °· Ask for help with household chores, cooking, grocery shopping, or running errands as needed. Do not try to do everything. °· Talk to people close to you about how you are feeling. Get support from your partner, family members, friends, or other new moms. °· Try to stay positive in how you think. Think about the things you are grateful for. °· Do not spend a lot of time alone. °· Only take  over-the-counter or prescription medicine as directed by your health care provider. °· Keep all your postpartum appointments. °· Let your health care provider know if you have any concerns. °Contact a health care provider if: °You are having a reaction to or problems with your medicine. °Get help right away if: °· You have suicidal feelings. °· You think you may harm the baby or someone else. °This information is not intended to replace advice given to you by your health care provider. Make sure you discuss any questions you have with your health care provider. °Document Released: 04/20/2004 Document Revised: 12/23/2015 Document Reviewed: 04/28/2013 °Elsevier Interactive Patient Education © 2017 [employer] ° ° °

## 2019-02-15 ENCOUNTER — Other Ambulatory Visit: Payer: Self-pay | Admitting: Otolaryngology

## 2019-02-15 DIAGNOSIS — H9313 Tinnitus, bilateral: Secondary | ICD-10-CM

## 2019-12-12 ENCOUNTER — Other Ambulatory Visit: Payer: Self-pay | Admitting: Physician Assistant

## 2019-12-12 DIAGNOSIS — H93233 Hyperacusis, bilateral: Secondary | ICD-10-CM

## 2019-12-12 DIAGNOSIS — H9313 Tinnitus, bilateral: Secondary | ICD-10-CM

## 2020-01-19 ENCOUNTER — Ambulatory Visit (INDEPENDENT_AMBULATORY_CARE_PROVIDER_SITE_OTHER): Payer: Self-pay | Admitting: Family Medicine

## 2020-01-29 ENCOUNTER — Ambulatory Visit
Admission: RE | Admit: 2020-01-29 | Discharge: 2020-01-29 | Disposition: A | Discharge status: Home / Self Care | Payer: Medicaid Other | Source: Ambulatory Visit | Attending: Physician Assistant | Admitting: Physician Assistant

## 2020-01-29 ENCOUNTER — Other Ambulatory Visit: Payer: Self-pay | Admitting: Physician Assistant

## 2020-01-29 ENCOUNTER — Other Ambulatory Visit: Payer: Self-pay

## 2020-01-29 DIAGNOSIS — H9313 Tinnitus, bilateral: Secondary | ICD-10-CM

## 2020-01-29 DIAGNOSIS — H93233 Hyperacusis, bilateral: Secondary | ICD-10-CM

## 2020-01-29 IMAGING — MR MR HEAD W/O CM
9 series · 40 of 48 positions shown · non-contrast
Comparison: None.

CLINICAL DATA: Tinnitus both ears.  Hyperacusis both ears.

EXAM:
MRI HEAD WITHOUT CONTRAST
TECHNIQUE: Multiplanar, multiecho pulse sequences of the brain and surrounding
structures were obtained without intravenous contrast.

[Series 3: T1 · sagittal · 5.0mm · 0.45mm/px · 4 of 24 slices shown (1 of 3)]
[im 1/24]
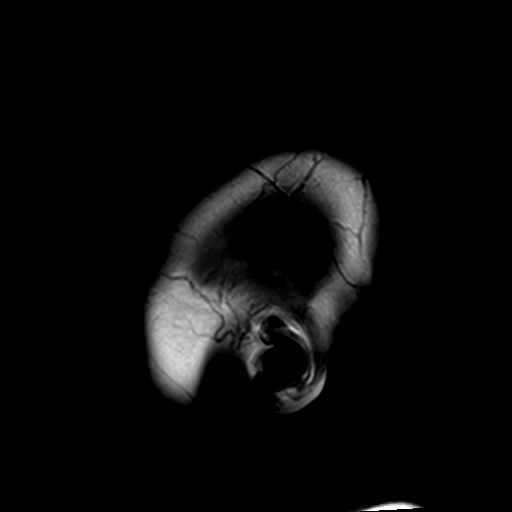
[im 8/24]
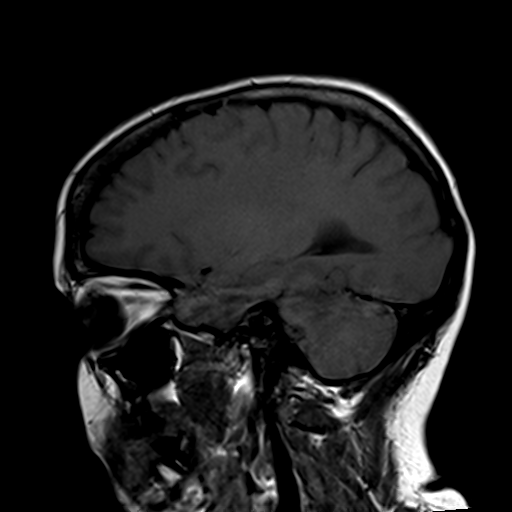
[im 16/24]
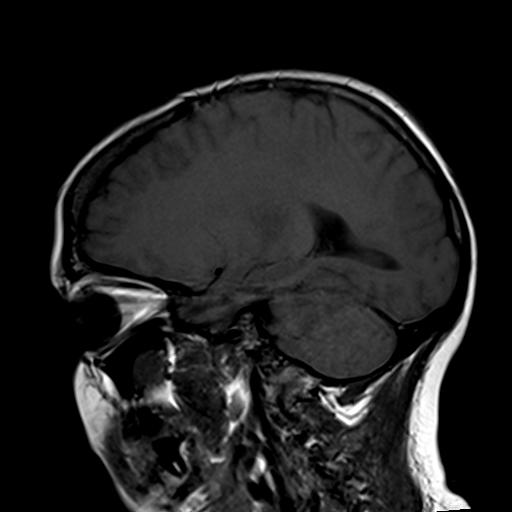
[im 24/24]
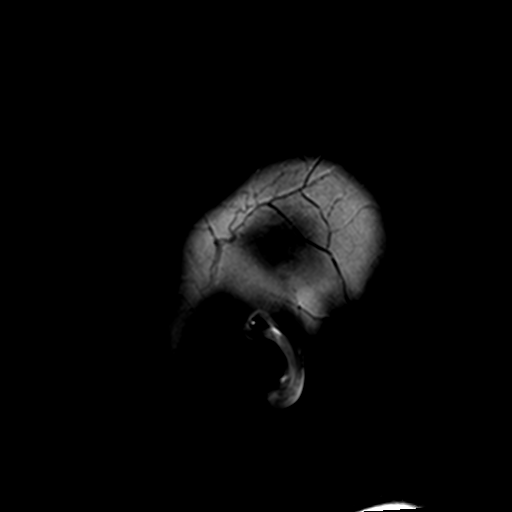

[Series 4: ep2d_diff_3 · axial · 3.0mm · 1.80mm/px · z∈[-24,+121]mm · 8 of 100 slices shown]
[im 1/100]
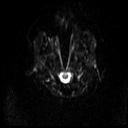
[im 17/100]
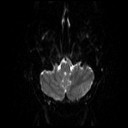
[im 34/100]
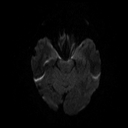
[im 42/100]
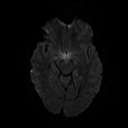
[im 58/100]
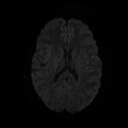
[im 67/100]
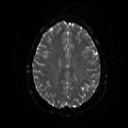
[im 83/100]
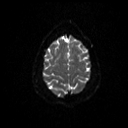
[im 100/100]
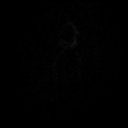

[Series 5: ep2d_diff_3_adc · axial · 3.0mm · 1.80mm/px · z∈[-24,+121]mm · 7 of 50 slices shown]
[im 1/50]
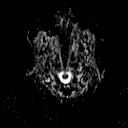
[im 9/50]
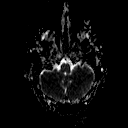
[im 17/50]
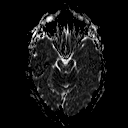
[im 25/50]
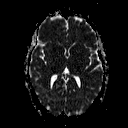
[im 33/50]
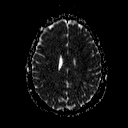
[im 41/50]
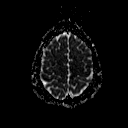
[im 50/50]
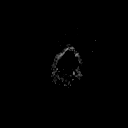

[Series 6: t2_tse_tra_512 · axial · 5.0mm · 0.60mm/px · z∈[-27,+127]mm · 3 of 26 slices shown]
[im 1/26]
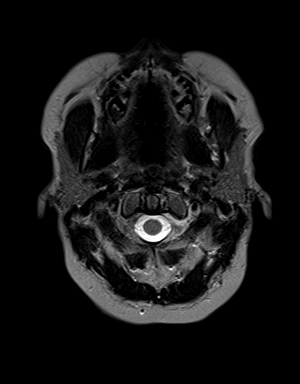
[im 13/26]
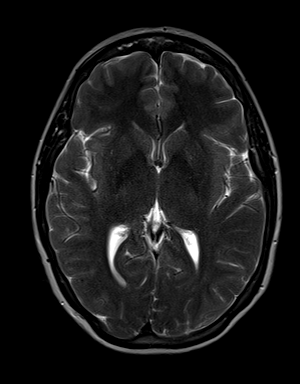
[im 26/26]
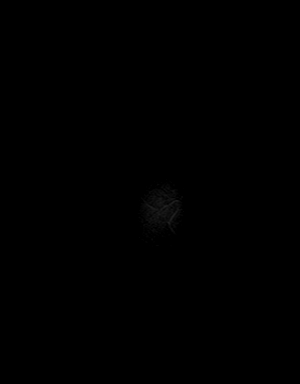

[Series 8: swi_images · axial · 2.0mm · 0.90mm/px · z∈[-20,+120]mm · 8 of 72 slices shown]
[im 1/72]
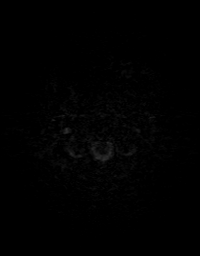
[im 9/72]
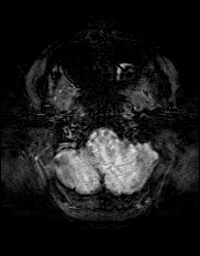
[im 18/72]
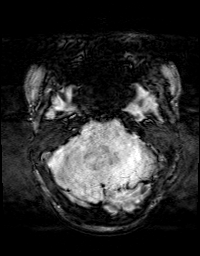
[im 27/72]
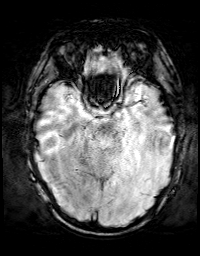
[im 45/72]
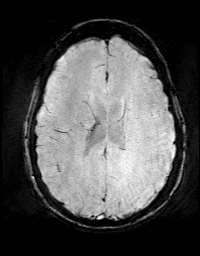
[im 54/72]
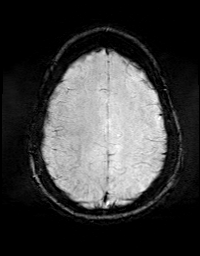
[im 63/72]
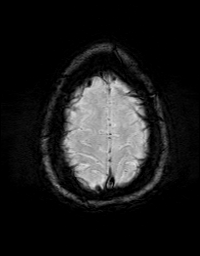
[im 72/72]
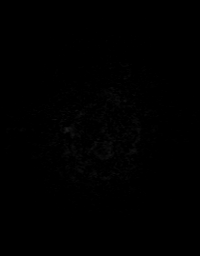

[Series 9: FLAIR · axial · 3.0mm · 0.43mm/px · z∈[-27,+127]mm · 4 of 27 slices shown]
[im 1/27]
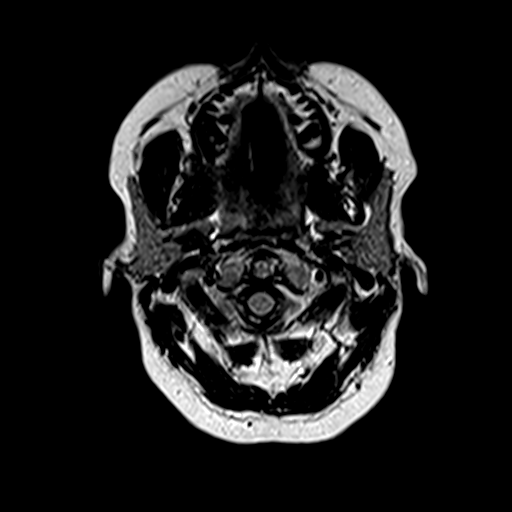
[im 9/27]
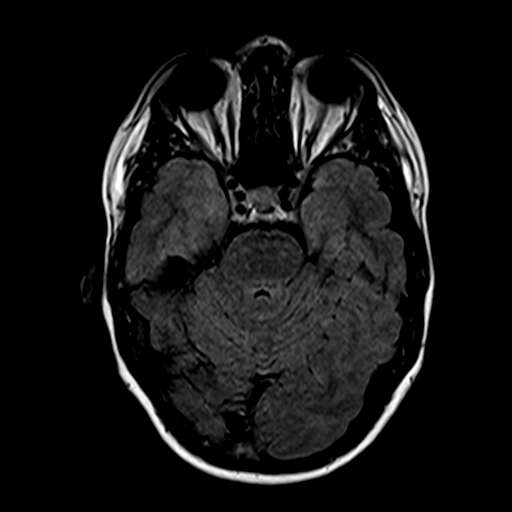
[im 18/27]
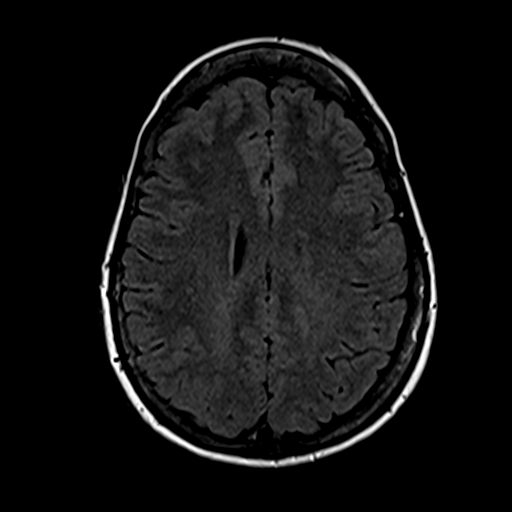
[im 27/27]
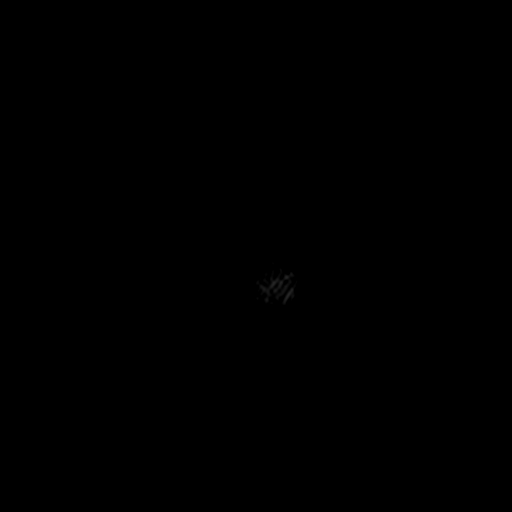

[Series 10: T1 · coronal · 3.0mm · 0.35mm/px · 1 of 11 slices shown (2 of 3)]
[im 1/11]
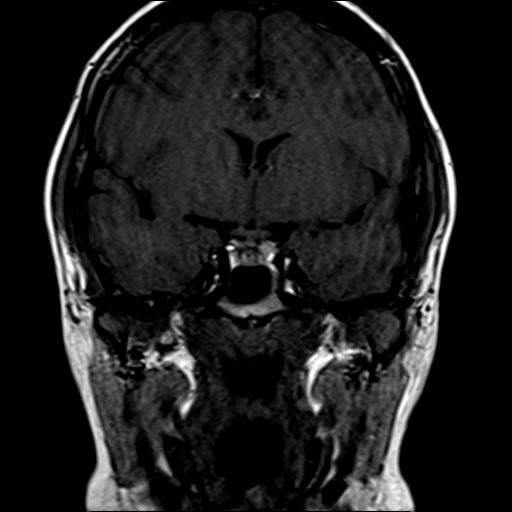

[Series 11: T1 · axial · 3.0mm · 0.35mm/px · 1 of 11 slices shown (3 of 3)]
[im 1/11]
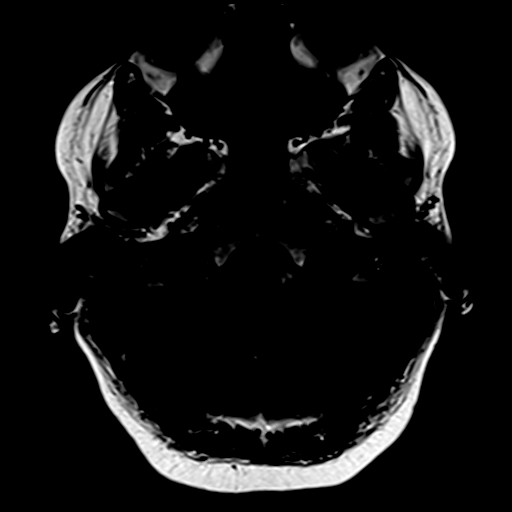

[Series 12: bSSFP · axial · 0.7mm · 0.28mm/px · z∈[+18,+35]mm · 4 of 44 slices shown]
[im 1/44]
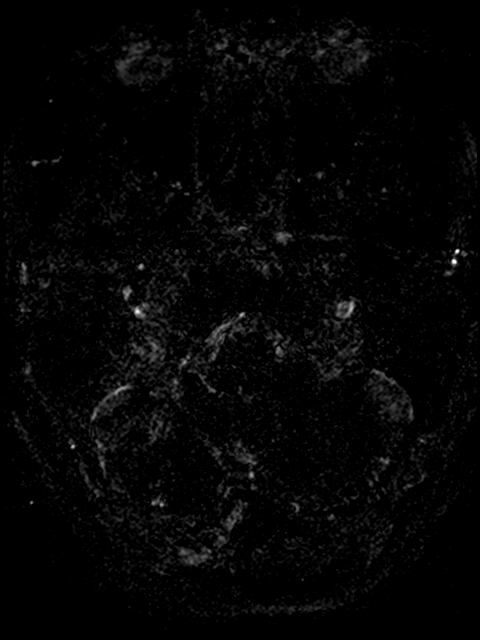
[im 9/44]
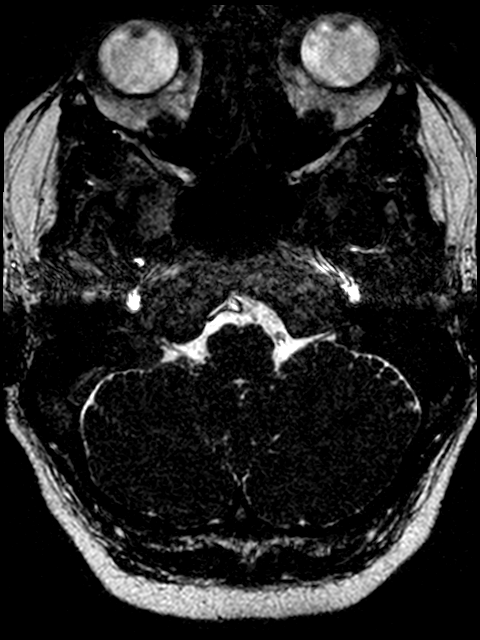
[im 18/44]
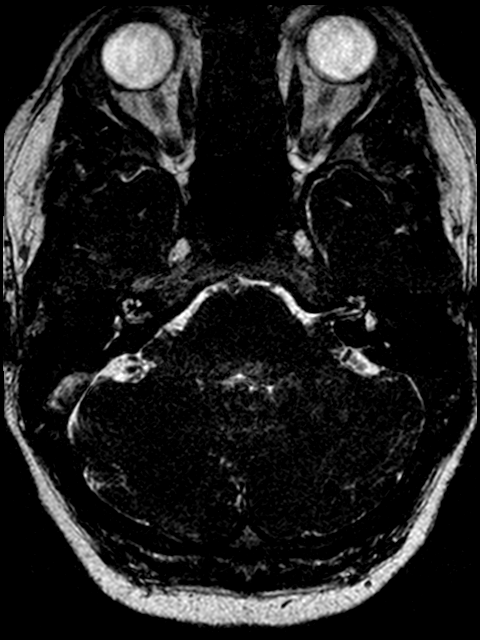
[im 26/44]
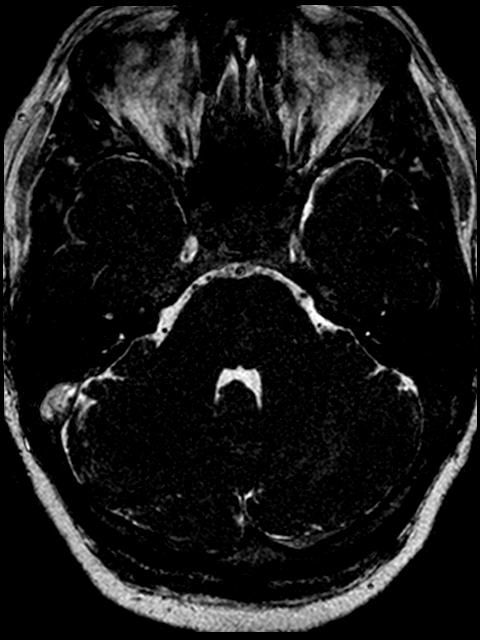

[40 of 48 positions shown; findings below may reference images not displayed]

FINDINGS: Brain: No acute infarction, hemorrhage, hydrocephalus, extra-axial
collection or mass lesion.

No cerebellopontine angle mass or internal auditory canal lesion is
demonstrated.Normal appearance of the 7th and 8th cranial nerves
bilaterally.

Vascular: Normal flow voids.

Skull and upper cervical spine: Normal marrow signal.

Sinuses/Orbits: Mucous retention cyst in the left maxillary sinus.
The orbits are maintained.

Other: None.
IMPRESSION: 1. No acute intracranial abnormality.
2. No cerebellopontine angle mass or internal auditory canal lesion.
Normal appearance of the 7th and 8th cranial nerves bilaterally.
3. Mucous retention cyst in the left maxillary sinus.

## 2020-01-29 MED ORDER — GADOBENATE DIMEGLUMINE 529 MG/ML IV SOLN: 16.0000 mL | Freq: Once | INTRAVENOUS | Status: DC | PRN

## 2020-02-03 ENCOUNTER — Ambulatory Visit (INDEPENDENT_AMBULATORY_CARE_PROVIDER_SITE_OTHER): Payer: Self-pay | Admitting: Family Medicine

## 2021-06-14 ENCOUNTER — Other Ambulatory Visit: Payer: Self-pay

## 2021-06-14 ENCOUNTER — Ambulatory Visit: Payer: Medicaid Other | Admitting: Physician Assistant

## 2021-06-14 ENCOUNTER — Encounter: Payer: Self-pay | Admitting: Physician Assistant

## 2021-06-14 VITALS — BP 120/80 | HR 78 | Temp 97.9°F | Ht 61.0 in | Wt 176.2 lb

## 2021-06-14 DIAGNOSIS — E559 Vitamin D deficiency, unspecified: Secondary | ICD-10-CM | POA: Diagnosis not present

## 2021-06-14 DIAGNOSIS — Z13220 Encounter for screening for lipoid disorders: Secondary | ICD-10-CM | POA: Diagnosis not present

## 2021-06-14 DIAGNOSIS — Z131 Encounter for screening for diabetes mellitus: Secondary | ICD-10-CM

## 2021-06-14 DIAGNOSIS — R5383 Other fatigue: Secondary | ICD-10-CM | POA: Diagnosis not present

## 2021-06-14 DIAGNOSIS — H938X2 Other specified disorders of left ear: Secondary | ICD-10-CM

## 2021-06-14 DIAGNOSIS — H9313 Tinnitus, bilateral: Secondary | ICD-10-CM

## 2021-06-14 LAB — CBC WITH DIFFERENTIAL/PLATELET
Basophils Absolute: 0 10*3/uL (ref 0.0–0.1)
Basophils Relative: 0.4 % (ref 0.0–3.0)
Eosinophils Absolute: 0.1 10*3/uL (ref 0.0–0.7)
Eosinophils Relative: 1.8 % (ref 0.0–5.0)
HCT: 40.6 % (ref 36.0–46.0)
Hemoglobin: 13.4 g/dL (ref 12.0–15.0)
Lymphocytes Relative: 29.4 % (ref 12.0–46.0)
Lymphs Abs: 1.7 10*3/uL (ref 0.7–4.0)
MCHC: 32.9 g/dL (ref 30.0–36.0)
MCV: 84.9 fl (ref 78.0–100.0)
Monocytes Absolute: 0.5 10*3/uL (ref 0.1–1.0)
Monocytes Relative: 8.9 % (ref 3.0–12.0)
Neutro Abs: 3.5 10*3/uL (ref 1.4–7.7)
Neutrophils Relative %: 59.5 % (ref 43.0–77.0)
Platelets: 249 10*3/uL (ref 150.0–400.0)
RBC: 4.78 Mil/uL (ref 3.87–5.11)
RDW: 12.8 % (ref 11.5–15.5)
WBC: 5.9 10*3/uL (ref 4.0–10.5)

## 2021-06-14 LAB — COMPREHENSIVE METABOLIC PANEL
ALT: 15 U/L (ref 0–35)
AST: 18 U/L (ref 0–37)
Albumin: 4.6 g/dL (ref 3.5–5.2)
Alkaline Phosphatase: 42 U/L (ref 39–117)
BUN: 14 mg/dL (ref 6–23)
CO2: 27 mEq/L (ref 19–32)
Calcium: 9.4 mg/dL (ref 8.4–10.5)
Chloride: 105 mEq/L (ref 96–112)
Creatinine, Ser: 0.71 mg/dL (ref 0.40–1.20)
GFR: 108.46 mL/min (ref >60.00)
Glucose, Bld: 91 mg/dL (ref 70–99)
Potassium: 5 mEq/L (ref 3.5–5.1)
Sodium: 138 mEq/L (ref 135–145)
Total Bilirubin: 0.6 mg/dL (ref 0.2–1.2)
Total Protein: 7.5 g/dL (ref 6.0–8.3)

## 2021-06-14 LAB — LIPID PANEL
Cholesterol: 191 mg/dL (ref 0–200)
HDL: 62.4 mg/dL (ref >39.00)
LDL Cholesterol: 118 mg/dL — ABNORMAL HIGH (ref 0–99)
NonHDL: 128.59
Total CHOL/HDL Ratio: 3
Triglycerides: 51 mg/dL (ref 0.0–149.0)
VLDL: 10.2 mg/dL (ref 0.0–40.0)

## 2021-06-14 LAB — VITAMIN B12: Vitamin B-12: 567 pg/mL (ref 211–911)

## 2021-06-14 LAB — TSH: TSH: 1.68 u[IU]/mL (ref 0.35–5.50)

## 2021-06-14 LAB — VITAMIN D 25 HYDROXY (VIT D DEFICIENCY, FRACTURES): VITD: 17.65 ng/mL — ABNORMAL LOW (ref 30.00–100.00)

## 2021-06-14 NOTE — Progress Notes (Signed)
Subjective:    Patient ID: Angie Sanchez, female    DOB: 1986/01/12, 35 y.o.   MRN: [id number]  Chief Complaint  Patient presents with   Ear Issues     HPI 35 y.o. patient presents today for new patient establishment with me.  Patient has not had PCP in awhile. 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    Current Care Team: OB/GYN at [facility]   Acute Concerns: Chronic tinnitus, but this is different. Feels like she is hearing her heartbeat in her left ear. Worse in the evenings and first thing upon awakening. On/off the last 2-3 weeks, worse in the last week. No pain. Two migraines in the last two weeks, which came after her menstrual cycle.   Chronic Concerns: See PMH listed below, as well as A/P for details on issues we specifically discussed during today's visit.      Past Medical History:  Diagnosis Date   Anemia    H/O varicella    Headache(784.0)    Tinnitus    since 2015   Urinary tract infection     Past Surgical History:  Procedure Laterality Date   nodule removal     from knee fingers and toes   OTHER SURGICAL HISTORY  2001-2002   foot, knee, and finger (granulomas)   WISDOM TOOTH EXTRACTION      Family History  Problem Relation Age of Onset   Arthritis Maternal Grandmother    Hypertension Maternal Grandmother    Arthritis Maternal Grandfather    Diabetes Maternal Grandfather    Heart disease Maternal Grandfather    Hypertension Maternal Grandfather    Depression Maternal Grandfather    Hypertension Mother    Depression Mother    Cancer Mother        pancreatic   Hyperlipidemia Father    Depression Father    Hypertension Father    Cancer Cousin        breast   Hypertension Paternal Grandmother    Parkinson's disease Paternal Grandmother     Social History   Tobacco Use   Smoking status: Never   Smokeless tobacco: Never  Vaping Use   Vaping Use: Never used  Substance Use Topics   Alcohol use: Not Currently    Comment: rarely    Drug use: No     No Known Allergies  Review of Systems NEGATIVE UNLESS OTHERWISE INDICATED IN HPI      Objective:     BP 120/80   Pulse 78   Temp 97.9 F (36.6 C)   Ht 5\' 1"  (1.549 m)   Wt 176 lb 3.2 oz (79.9 kg)   LMP 05/02/2021 (Approximate)   SpO2 98%   BMI 33.29 kg/m   Wt Readings from Last 3 Encounters:  06/14/21 176 lb 3.2 oz (79.9 kg)  04/07/18 168 lb 4 oz (76.3 kg)  01/22/18 165 lb 4 oz (75 kg)    BP Readings from Last 3 Encounters:  06/14/21 120/80  08/26/18 115/75  08/14/18 126/70     Physical Exam Vitals and nursing note reviewed.  Constitutional:      Appearance: Normal appearance. She is normal weight. She is not toxic-appearing.  HENT:     Head: Normocephalic and atraumatic.     Right Ear: Tympanic membrane, ear canal and external ear normal.     Left Ear: Tympanic membrane, ear canal and external ear normal.     Nose: Nose normal.     Mouth/Throat:     Mouth: Mucous  membranes are moist.  Eyes:     Extraocular Movements: Extraocular movements intact.     Conjunctiva/sclera: Conjunctivae normal.     Pupils: Pupils are equal, round, and reactive to light.  Cardiovascular:     Rate and Rhythm: Normal rate and regular rhythm.     Pulses: Normal pulses.     Heart sounds: Normal heart sounds.  Pulmonary:     Effort: Pulmonary effort is normal.     Breath sounds: Normal breath sounds.  Abdominal:     General: Abdomen is flat. Bowel sounds are normal.     Palpations: Abdomen is soft.  Musculoskeletal:        General: Normal range of motion.     Cervical back: Normal range of motion and neck supple.  Skin:    General: Skin is warm and dry.  Neurological:     General: No focal deficit present.     Mental Status: She is alert and oriented to person, place, and time.  Psychiatric:        Mood and Affect: Mood normal.        Behavior: Behavior normal.        Thought Content: Thought content normal.        Judgment: Judgment normal.        Assessment & Plan:   Problem List Items Addressed This Visit   None Visit Diagnoses     Tinnitus of both ears    -  Primary   Relevant Orders   Vitamin B12   TSH   Comprehensive metabolic panel   CBC with Differential/Platelet   VITAMIN D 25 Hydroxy (Vit-D Deficiency, Fractures)   Audible heartbeat in left ear       Other fatigue       Relevant Orders   Vitamin B12   TSH   Comprehensive metabolic panel   CBC with Differential/Platelet   VITAMIN D 25 Hydroxy (Vit-D Deficiency, Fractures)   Vitamin D deficiency       Relevant Orders   VITAMIN D 25 Hydroxy (Vit-D Deficiency, Fractures)   Diabetes mellitus screening       Screening for cholesterol level       Relevant Orders   Lipid panel      Plan: -Labs today, will call with results -Discussed new "heartbeat in left ear" with my SP and patient. Since this is a change from previous tinnitus sounds, advised looking into MRA head / neck. She states she did not tolerate last MRI well because of the sounds and she is nervous at the thought of another one. -She is going to call her ENT for advice on this. -Plan for f/up 1 year CPE, sooner if any concerns.   This note was prepared with assistance of Conservation officer, historic buildings. Occasional wrong-word or sound-a-like substitutions may have occurred due to the inherent limitations of voice recognition software.  Time Spent: 36 minutes of total time was spent on the date of the encounter performing the following actions: chart review prior to seeing the patient, obtaining history, performing a medically necessary exam, counseling on the treatment plan, placing orders, and documenting in our EHR.    Emmaline Wahba M Roshard Rezabek, PA-C

## 2021-06-14 NOTE — Patient Instructions (Signed)
Good to meet you today! Please go to the lab for blood work and I will send results through MyChart. Call your ENT and discuss your new symptoms with them - see if they would want to proceed with MRA head / neck. Let me know either way!

## 2021-06-15 ENCOUNTER — Encounter: Payer: Self-pay | Admitting: Physician Assistant

## 2021-06-15 ENCOUNTER — Other Ambulatory Visit: Payer: Self-pay

## 2021-06-15 ENCOUNTER — Other Ambulatory Visit (HOSPITAL_BASED_OUTPATIENT_CLINIC_OR_DEPARTMENT_OTHER): Payer: Self-pay

## 2021-06-15 ENCOUNTER — Telehealth: Payer: Self-pay

## 2021-06-15 MED ORDER — VITAMIN D (ERGOCALCIFEROL) 1.25 MG (50000 UNIT) PO CAPS
50000.0000 [IU] | ORAL_CAPSULE | ORAL | 0 refills | Status: DC
  Filled 2021-06-15: qty 12, 84d supply, fill #0

## 2021-06-15 NOTE — Telephone Encounter (Signed)
Patient is returning a call from Khamika.  

## 2021-06-15 NOTE — Telephone Encounter (Signed)
See results notes. 

## 2021-09-27 NOTE — Progress Notes (Signed)
? ? ?Subjective:   ? ?CC: R great toe pain ? ?I, Christoper Fabian, LAT, ATC, am serving as scribe for Dr. Clementeen Graham. ? ?HPI: Pt is a 36 y/o female presenting w/ c/o R great toe pain/cyst and swelling x 20 years that has been getting bigger and more sensitive recently.  She locates her pain to the plantar surface of her great toe. ? ?Swelling: yes ?Aggravating factors: stubbing her toe; different footwear; walking barefoot ?Treatments tried: Nothing ? ?She would also like to discuss another cyst on her R ant knee.  She has a hx of surgical removal of the knee cyst which has returned.  She also had a cortisone injection in her knee cyst approximately 10 years ago. ? ?Pertinent review of Systems: No fevers or chills ? ?Relevant historical information: Question cysts or lipomas in the knee with surgical removal in the early 2000's. ? ? ?Objective:   ? ?Vitals:  ? 09/28/21 1001  ?BP: 140/86  ?Pulse: 92  ?SpO2: 99%  ? ?General: Well Developed, well nourished, and in no acute distress.  ? ?MSK: Right knee: Mature scar anterior knee with mass anterior knee.  Multiple loculated small not very mobile firm nodules anterior knee.  Nontender.  Normal knee motion and strength. ? ?Right great toe: 3 small nodules on the plantar aspect of her right great toe distal phalanx.  Not particularly tender to palpation.  Minimally mobile.  Normal toe motion and strength. ? ?Lab and Radiology Results ? ?Diagnostic Limited MSK Ultrasound of: Right knee and right great toe ?The nodules on the plantar aspect of the toe are solid-appearing consistent with possible lipoma.  They do not appear to be cystic. ?The nodules on the anterior aspect of the knee are also not cystic in appearance and appear to be possible lipomas. ?No significant blood flow within either the toe or knee nodules. ?Impression: Possible lipomatous ? ?X-ray images right knee and foot obtained today personally and independently interpreted ? ?Right knee: Mild medial  compartment DJD.  No significant bony abnormalities present.  Soft tissue swelling anterior knee present. ? ?Right foot: No significant bony maladies around the great toe.  Soft tissue swelling is present. ? ?Await formal radiology review ? ? ? ?Impression and Recommendations:   ? ?Assessment and Plan: ?36 y.o. female with  ?Soft tissue mass at the plantar right great toe and anterior knee.  This is consistent with a lipoma and not ganglion cyst.  The foot mass is painful and interfering with her gait and is getting to the point now where surgical excision makes sense.  The anterior knee mass has been excised in the past and regrew but is not bothersome enough at this time that she would like to consider surgery. ? ?Plan for MRI of the right foot to evaluate the great toe mass for potential surgical planning.  Very likely refer to podiatry based on the results of the MRI. ? ?Watchful waiting for the anterior knee mass for now. ? ?PDMP not reviewed this encounter. ?Orders Placed This Encounter  ?Procedures  ? [place] LIMITED JOINT SPACE STRUCTURES LOW RIGHT(NO LINKED CHARGES)  ?  Order Specific Question:   Reason for Exam (SYMPTOM  OR DIAGNOSIS REQUIRED)  ?  Answer:   R great toe  ?  Order Specific Question:   Preferred imaging location?  ?  Answer:   [facility]  ? DG Foot Complete Right  ?  Standing Status:   Future  ?  Number  of Occurrences:   1  ?  Standing Expiration Date:   10/29/2021  ?  Order Specific Question:   Reason for Exam (SYMPTOM  OR DIAGNOSIS REQUIRED)  ?  Answer:   R foot pain  ?  Order Specific Question:   Is patient pregnant?  ?  Answer:   No  ?  Order Specific Question:   Preferred imaging location?  ?  Answer:   [facility]  ? DG Knee AP/LAT W/Sunrise Right  ?  Standing Status:   Future  ?  Number of Occurrences:   1  ?  Standing Expiration Date:   10/29/2021  ?  Order Specific Question:   Reason for Exam (SYMPTOM  OR DIAGNOSIS REQUIRED)  ?  Answer:   R knee pain  ?   Order Specific Question:   Is patient pregnant?  ?  Answer:   No  ?  Order Specific Question:   Preferred imaging location?  ?  Answer:   [facility]  ? MR FOOT RIGHT WO CONTRAST  ?  Standing Status:   Future  ?  Standing Expiration Date:   10/29/2021  ?  Order Specific Question:   What is the patient's sedation requirement?  ?  Answer:   No Sedation  ?  Order Specific Question:   Does the patient have a pacemaker or implanted devices?  ?  Answer:   No  ?  Order Specific Question:   Preferred imaging location?  ?  Answer:   [facility] (table limit-350lbs)  ? ?No orders of the defined types were placed in this encounter. ? ? ?Discussed warning signs or symptoms. Please see discharge instructions. Patient expresses understanding. ? ? ?The above documentation has been reviewed and is accurate and complete Clementeen Graham, M.D. ? ?

## 2021-09-28 ENCOUNTER — Ambulatory Visit: Payer: Self-pay

## 2021-09-28 ENCOUNTER — Ambulatory Visit: Payer: Medicaid Other | Admitting: Family Medicine

## 2021-09-28 ENCOUNTER — Encounter: Payer: Self-pay | Admitting: Family Medicine

## 2021-09-28 ENCOUNTER — Ambulatory Visit (INDEPENDENT_AMBULATORY_CARE_PROVIDER_SITE_OTHER): Payer: Medicaid Other

## 2021-09-28 ENCOUNTER — Other Ambulatory Visit: Payer: Self-pay

## 2021-09-28 VITALS — BP 140/86 | HR 92 | Ht 61.0 in | Wt 176.6 lb

## 2021-09-28 DIAGNOSIS — M79674 Pain in right toe(s): Secondary | ICD-10-CM

## 2021-09-28 DIAGNOSIS — R2241 Localized swelling, mass and lump, right lower limb: Secondary | ICD-10-CM

## 2021-09-28 DIAGNOSIS — G8929 Other chronic pain: Secondary | ICD-10-CM

## 2021-09-28 DIAGNOSIS — M25561 Pain in right knee: Secondary | ICD-10-CM | POA: Diagnosis not present

## 2021-09-28 IMAGING — DX DG FOOT COMPLETE 3+V*R*
3 series · 3 of 3 positions shown · non-contrast
Comparison: None.

CLINICAL DATA: Right foot pain. Patient reports plantar great toe
pain. No known injury.

EXAM:
RIGHT FOOT COMPLETE - 3+ VIEW

[foot ap]
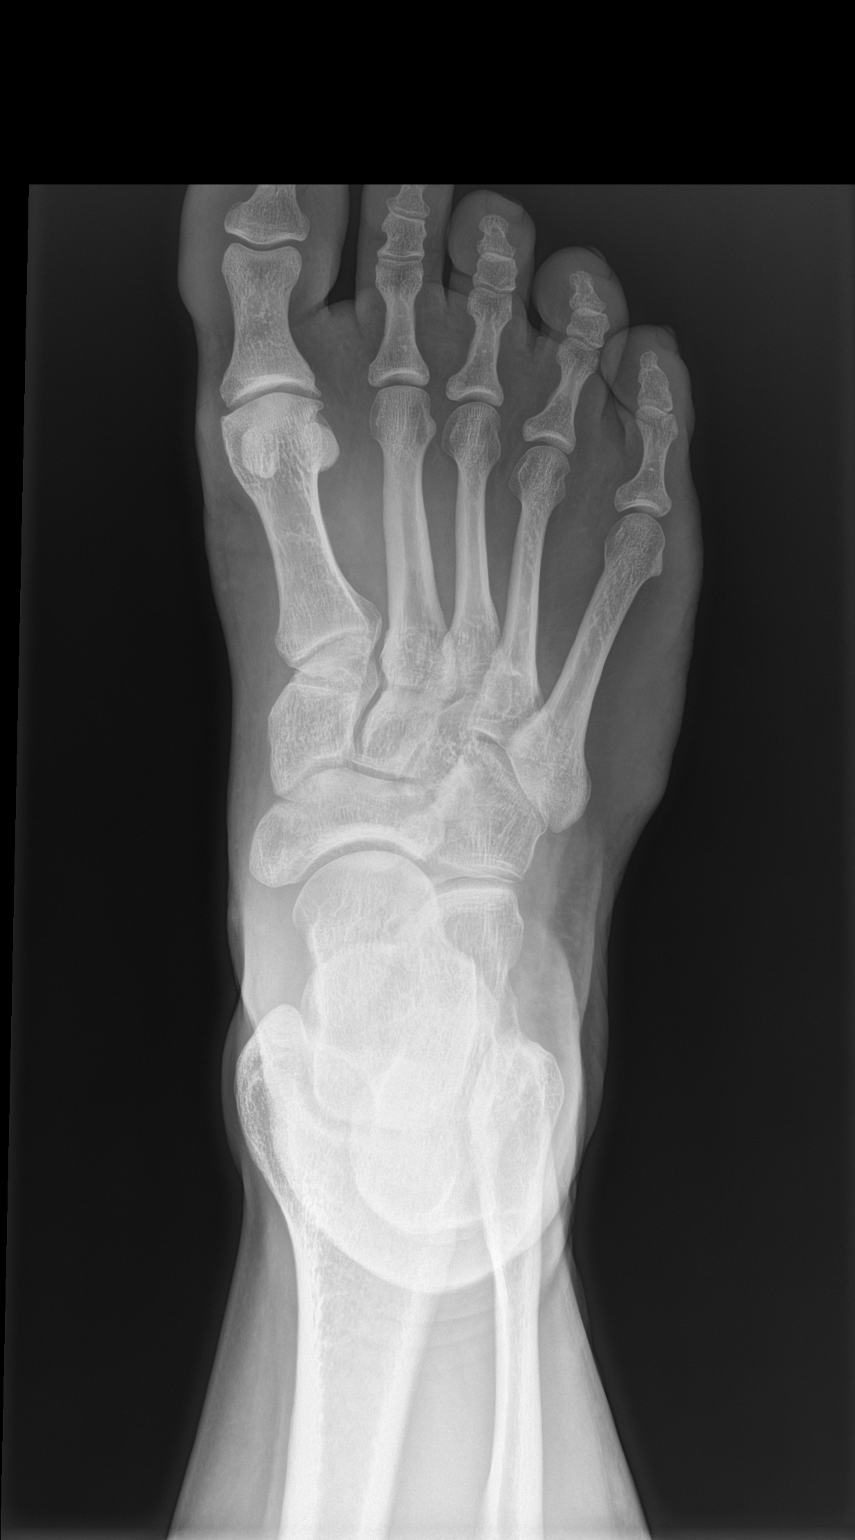

[foot obl]
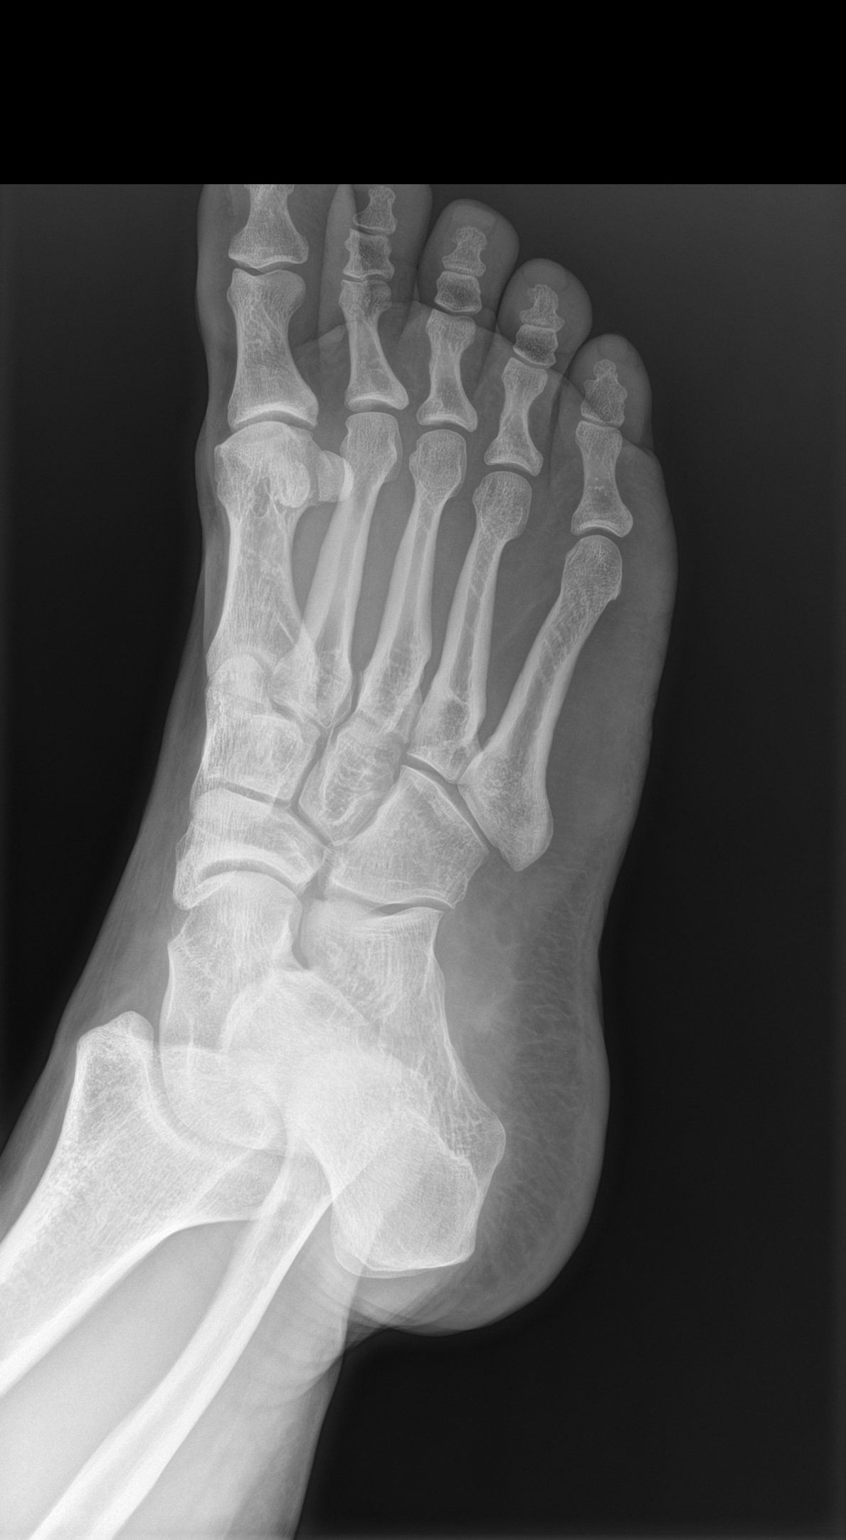

[foot lat]
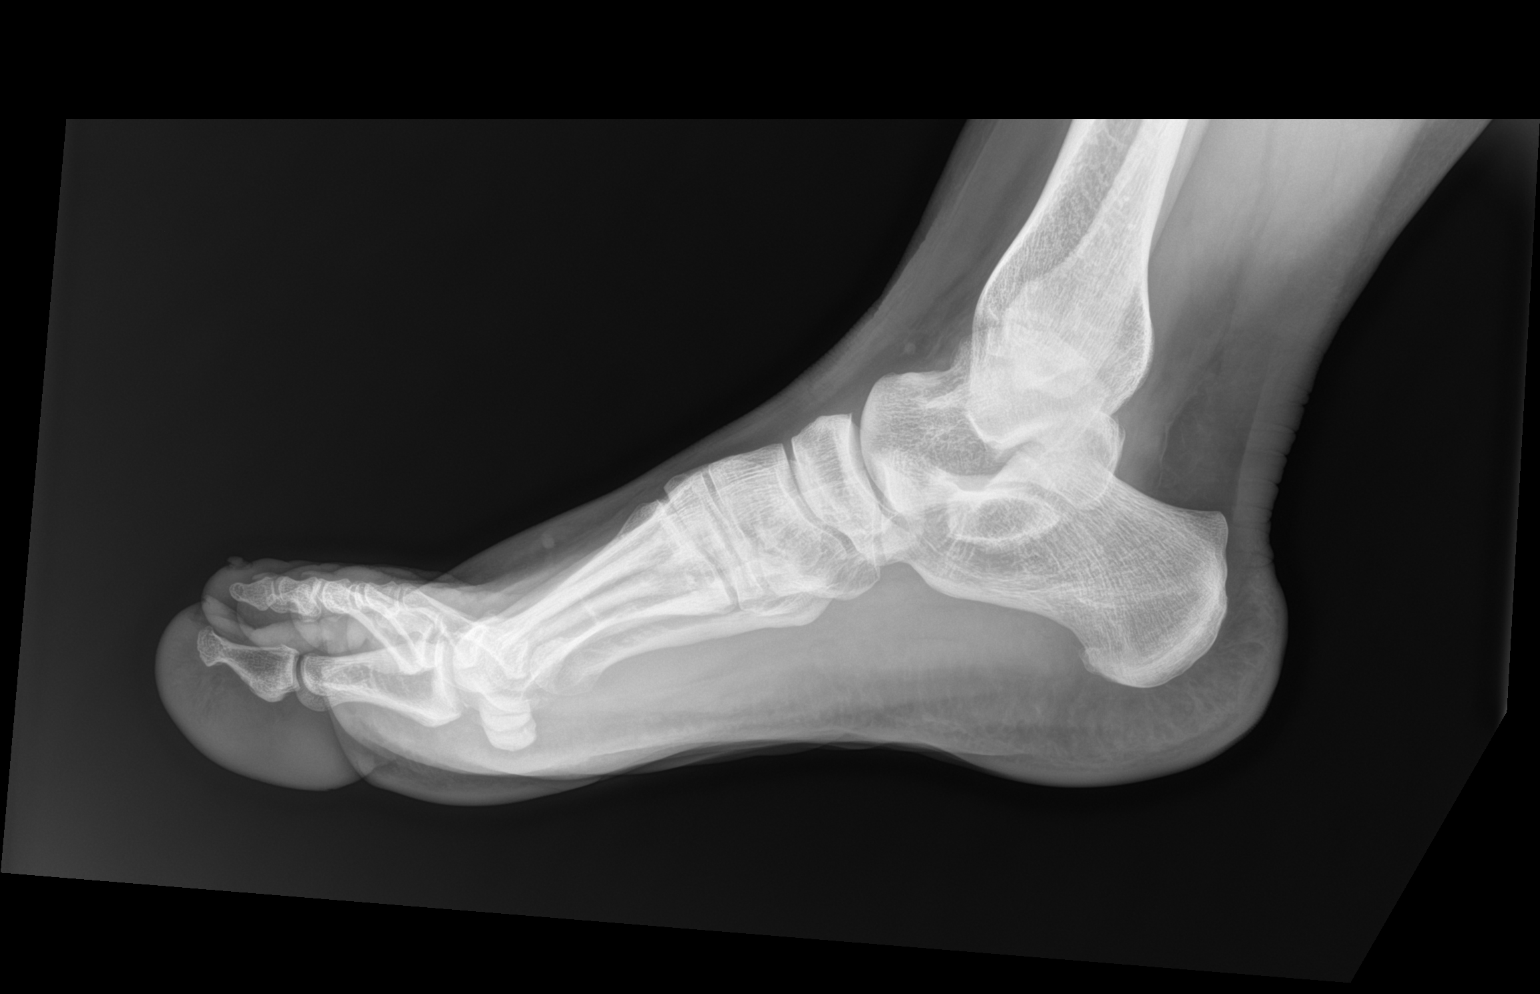

[3 of 3 positions shown; findings below may reference images not displayed]

FINDINGS: Normal alignment. Normal joint spaces. Trace spurring of the first
metatarsal phalangeal joint. No other osteophytes. No erosion or
periostitis. No focal bone lesion. No fracture. Tiny plantar
calcaneal spur. Unremarkable soft tissues.
IMPRESSION: 1. Trace spurring of the first metatarsal phalangeal joint.
2. Tiny plantar calcaneal spur.

## 2021-09-28 IMAGING — DX DG KNEE AP/LAT W/ SUNRISE*R*
3 series · 3 of 3 positions shown · non-contrast
Comparison: None.

CLINICAL DATA: Chronic pain of right knee.

EXAM:
RIGHT KNEE 3 VIEWS

[knee ap]
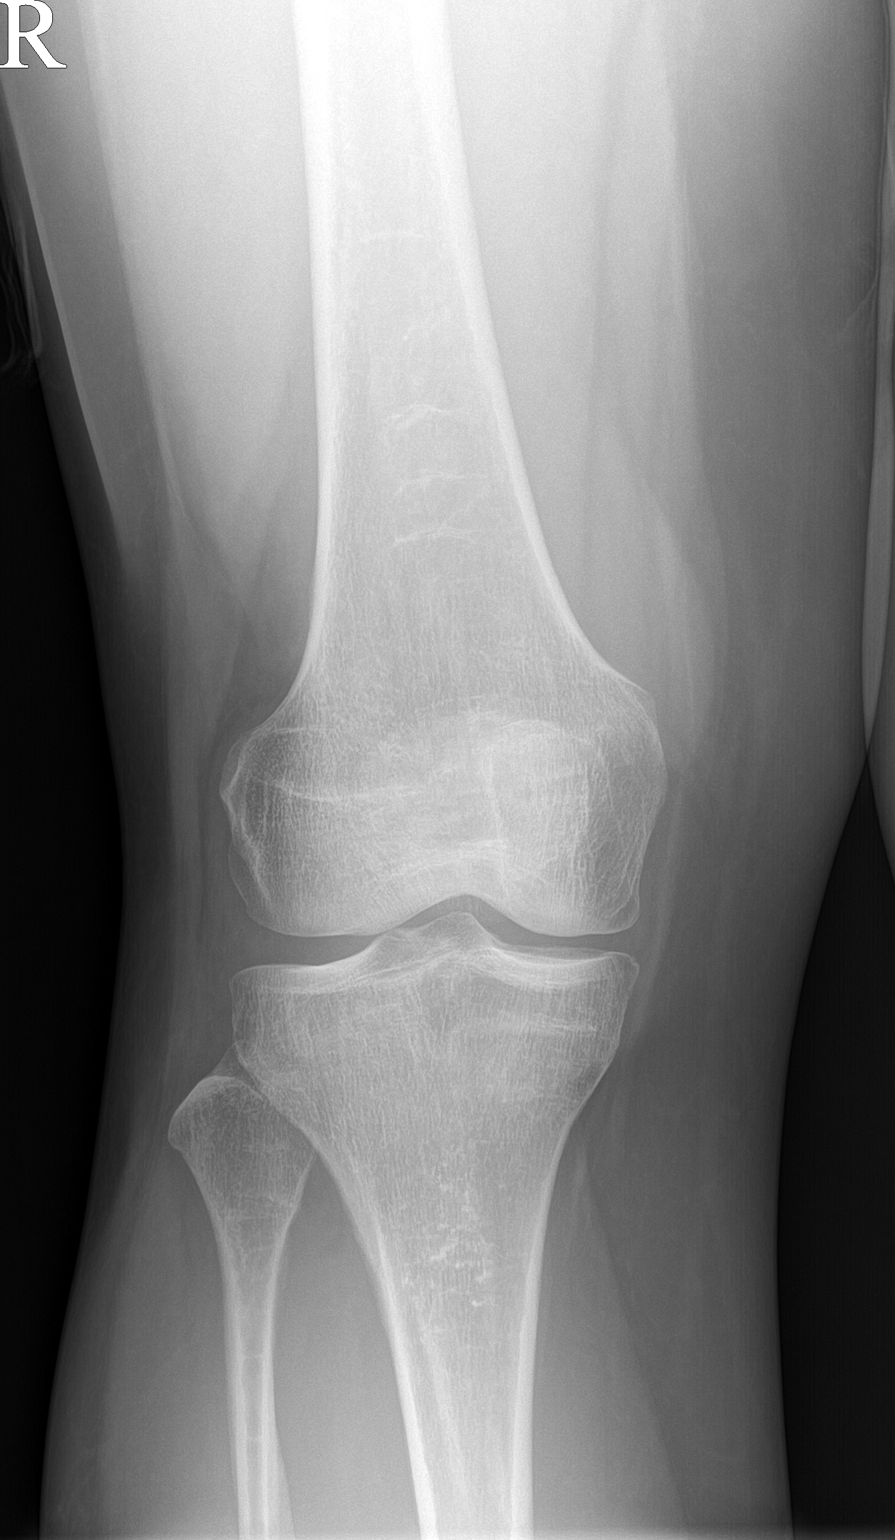

[knee lat]
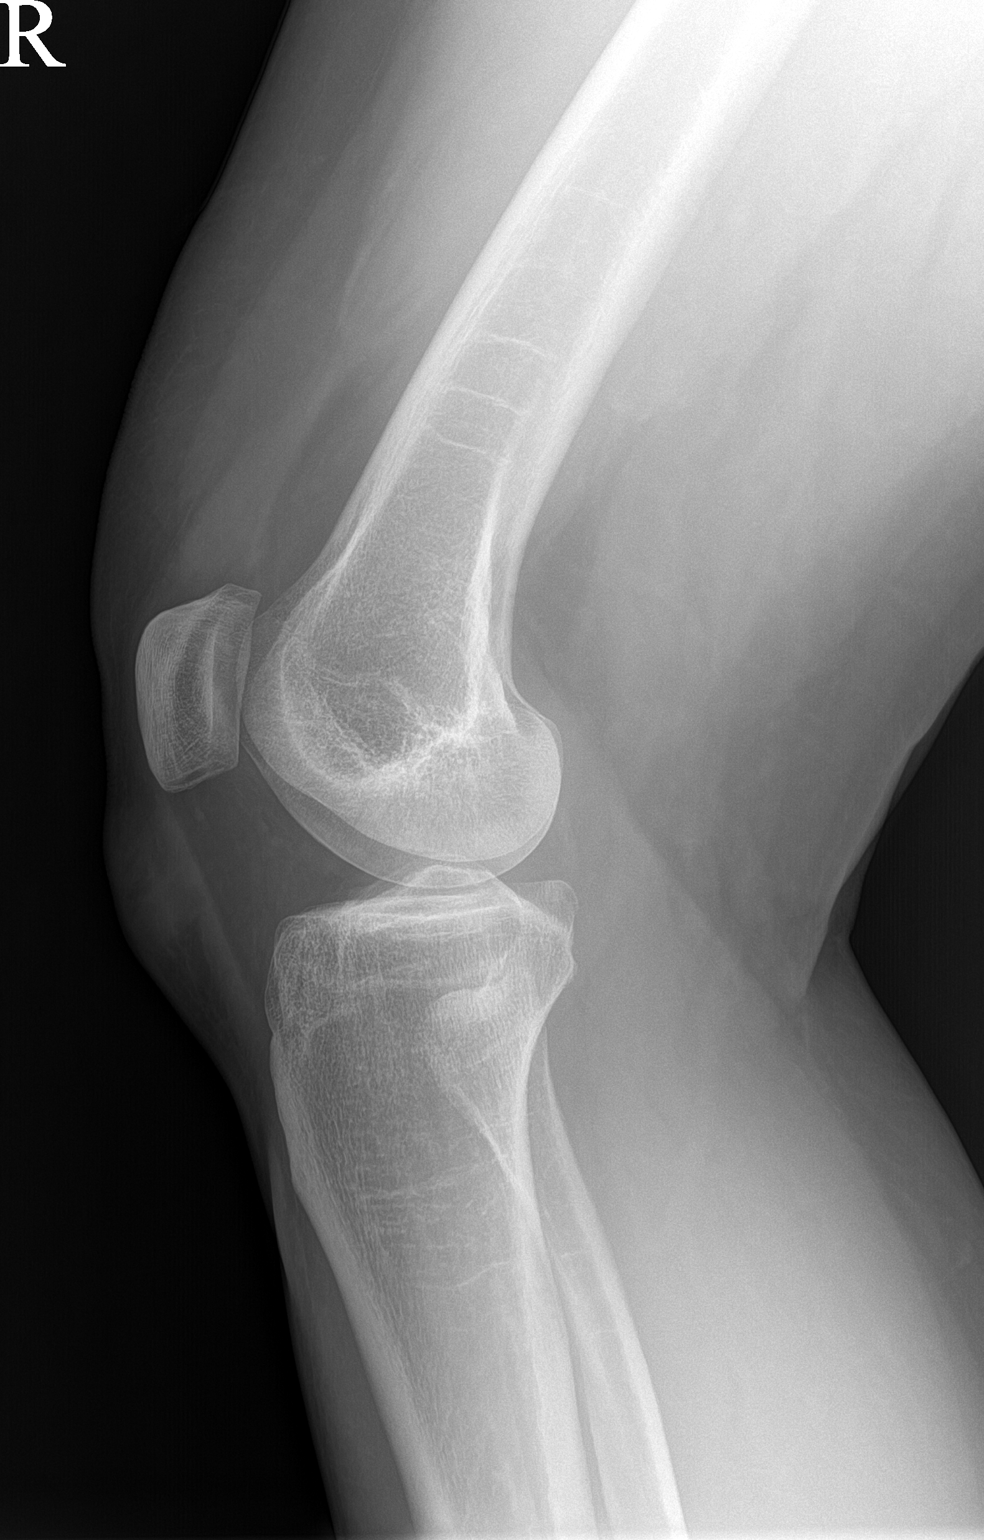

[patella]
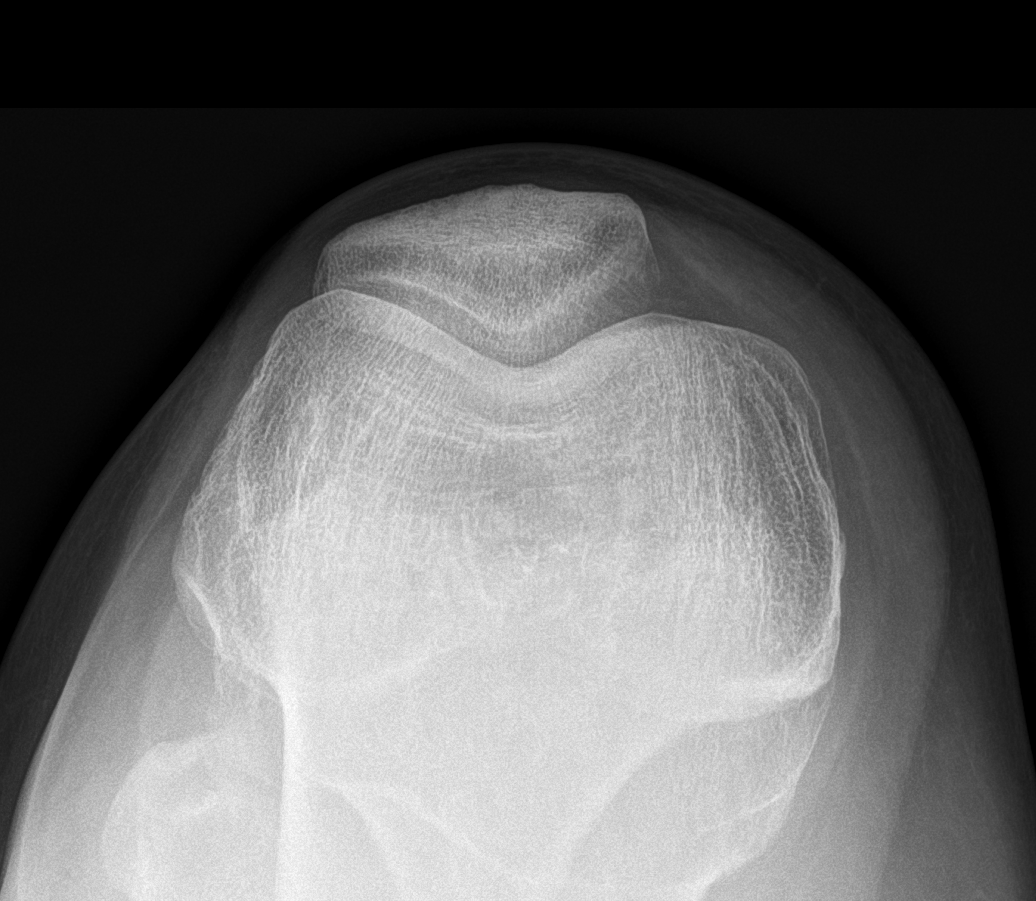

[3 of 3 positions shown; findings below may reference images not displayed]

FINDINGS: Normal alignment. Normal joint spaces. No osteophytes or erosions.
No fracture. No focal bone abnormality. There is prepatellar soft
tissue prominence/edema.
IMPRESSION: Prepatellar soft tissue prominence/edema. No osseous abnormality.

## 2021-09-28 NOTE — Patient Instructions (Addendum)
Nice to meet you today. ? ?Please get an Xray today before you leave. ? ?I've ordered a R foot MRI.  That facility will call you to schedule but please let us know if you haven't heard from them in one week regarding scheduling. ? ?Follow-up: after MRI ?

## 2021-09-30 NOTE — Progress Notes (Signed)
Right foot x-ray shows no fractures.  A little bit of arthritis present at the base of the big toe.

## 2021-09-30 NOTE — Progress Notes (Signed)
Right knee x-ray shows a little bit of soft tissue swelling but otherwise looks okay to radiology.

## 2021-10-01 ENCOUNTER — Ambulatory Visit (INDEPENDENT_AMBULATORY_CARE_PROVIDER_SITE_OTHER): Payer: Medicaid Other

## 2021-10-01 ENCOUNTER — Other Ambulatory Visit: Payer: Self-pay

## 2021-10-01 DIAGNOSIS — G8929 Other chronic pain: Secondary | ICD-10-CM

## 2021-10-01 DIAGNOSIS — M79674 Pain in right toe(s): Secondary | ICD-10-CM | POA: Diagnosis not present

## 2021-10-01 IMAGING — MR MR FOOT*R* W/O CM
5 series · 30 of 40 positions shown · non-contrast
Comparison: None.

CLINICAL DATA: Foot pain, chronic, osseous injury suspected R foot
pain/lipoma

EXAM:
MRI OF THE RIGHT FOREFOOT WITHOUT CONTRAST
TECHNIQUE: Multiplanar, multisequence MR imaging of the right forefoot was
performed. No intravenous contrast was administered.

[Series 3: T1 · coronal · 3.0mm · 0.47mm/px · 8 of 42 slices shown (1 of 2)]
[im 1/42]
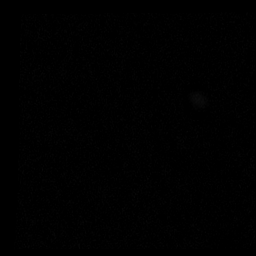
[im 5/42]
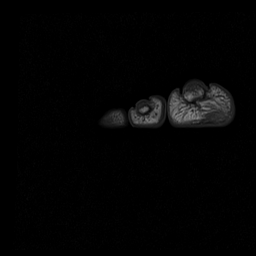
[im 14/42]
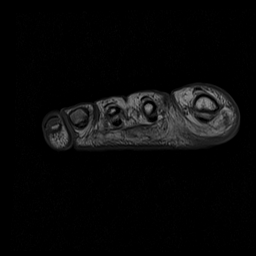
[im 19/42]
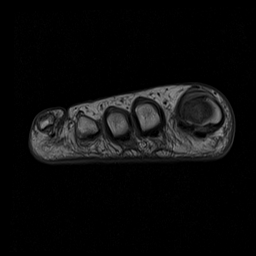
[im 23/42]
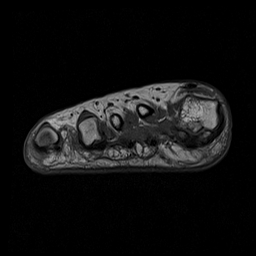
[im 28/42]
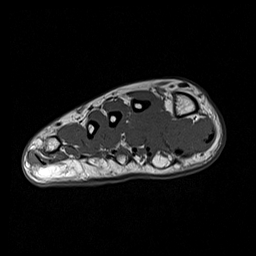
[im 37/42]
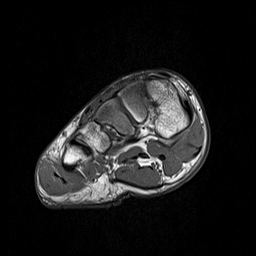
[im 42/42]
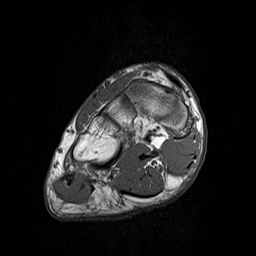

[Series 4: T2 fat-sat · coronal · 3.0mm · 0.47mm/px · 8 of 42 slices shown (1 of 2)]
[im 1/42]
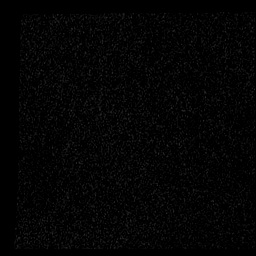
[im 5/42]
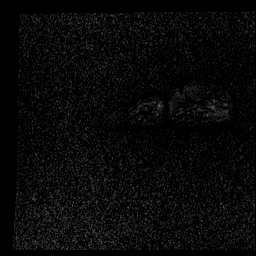
[im 14/42]
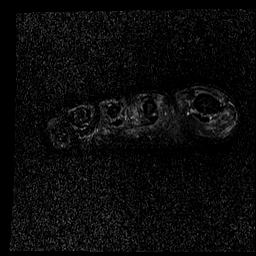
[im 19/42]
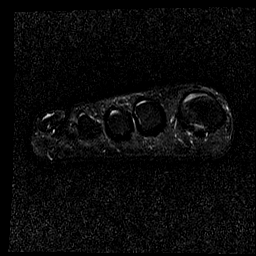
[im 23/42]
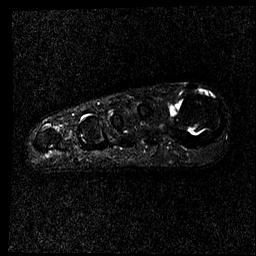
[im 28/42]
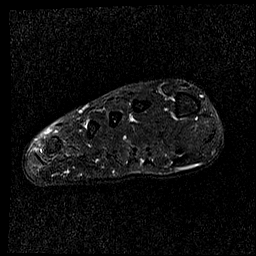
[im 37/42]
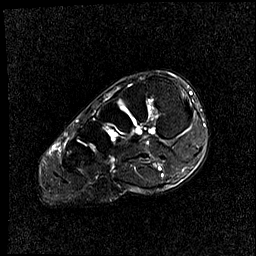
[im 42/42]
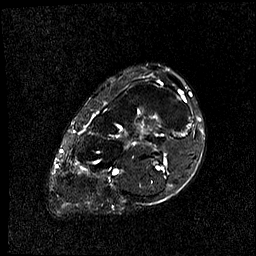

[Series 5: T1 · axial · 3.0mm · 0.31mm/px · z∈[-80,-13]mm · 6 of 23 slices shown (2 of 2)]
[im 1/23]
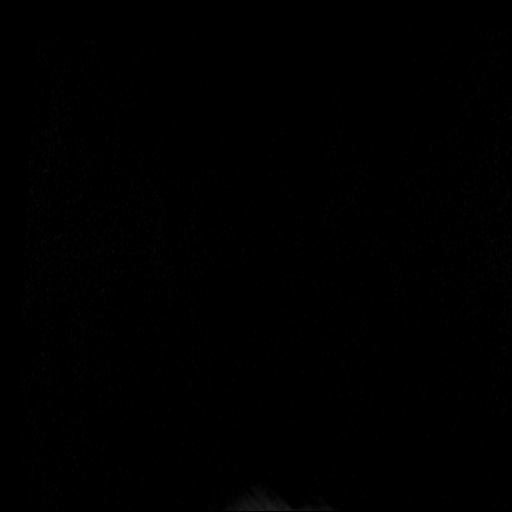
[im 5/23]
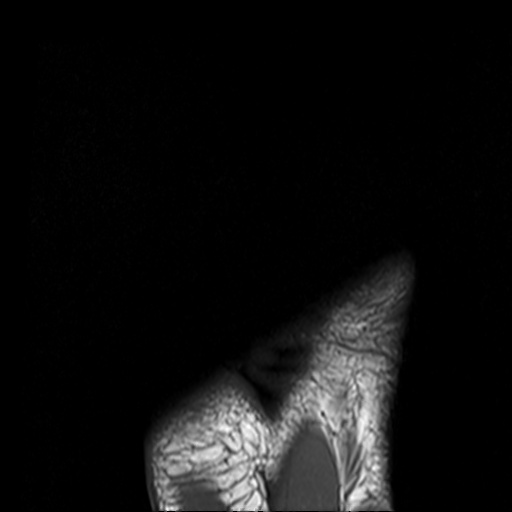
[im 9/23]
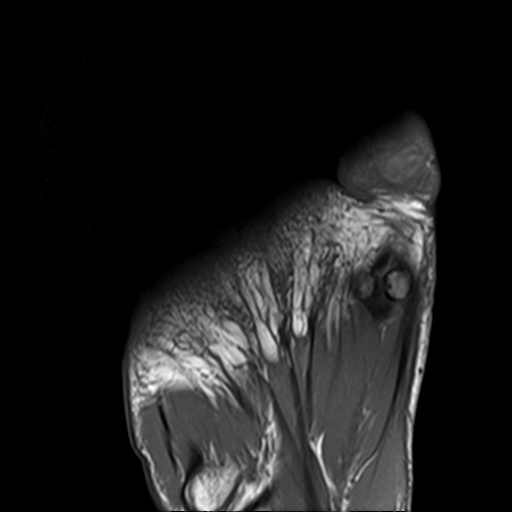
[im 14/23]
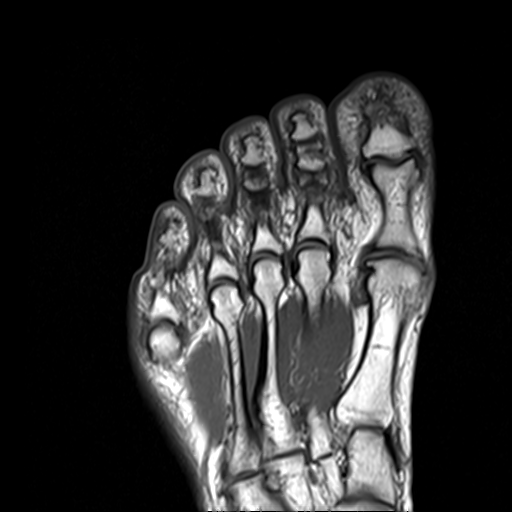
[im 18/23]
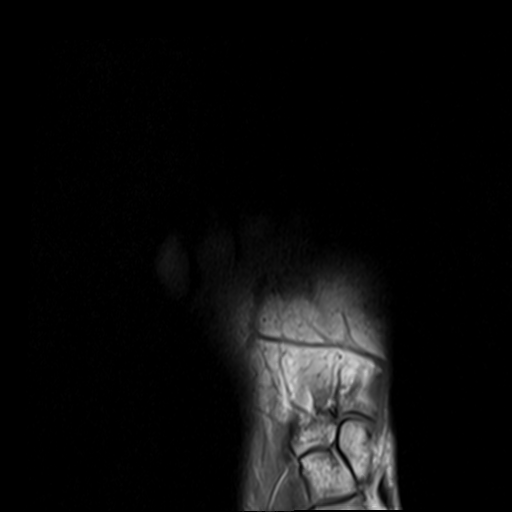
[im 23/23]
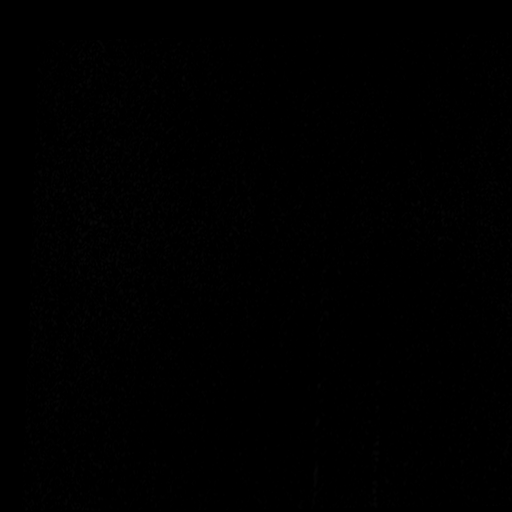

[Series 6: T2 fat-sat · axial · 3.0mm · 0.31mm/px · z∈[-80,-13]mm · 6 of 23 slices shown (2 of 2)]
[im 1/23]
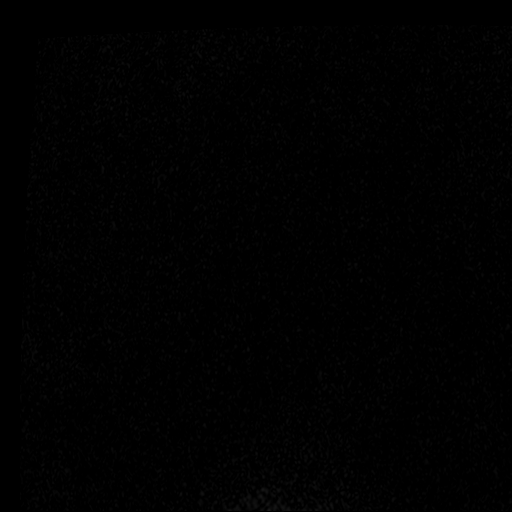
[im 5/23]
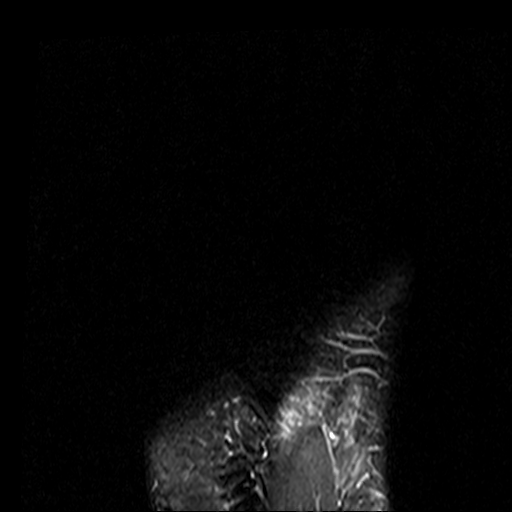
[im 9/23]
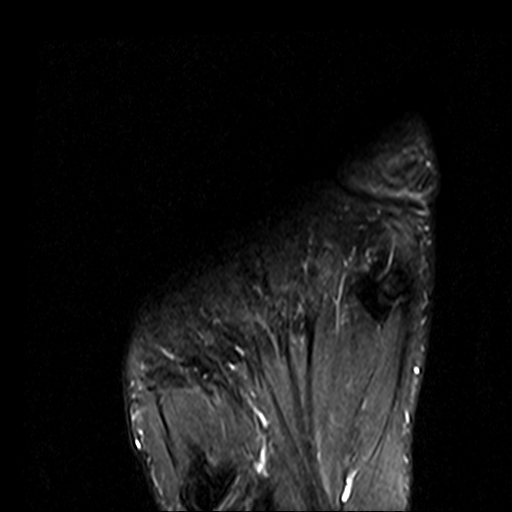
[im 14/23]
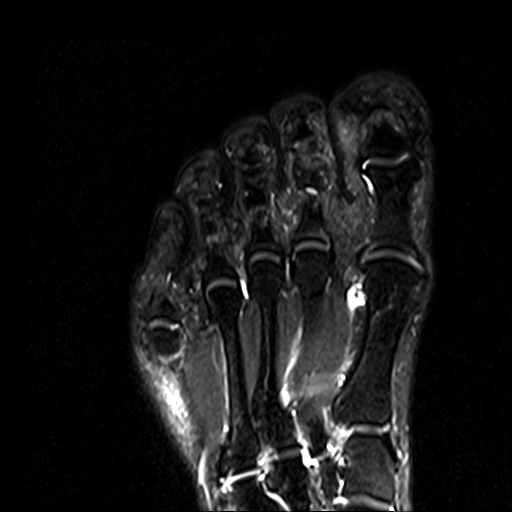
[im 18/23]
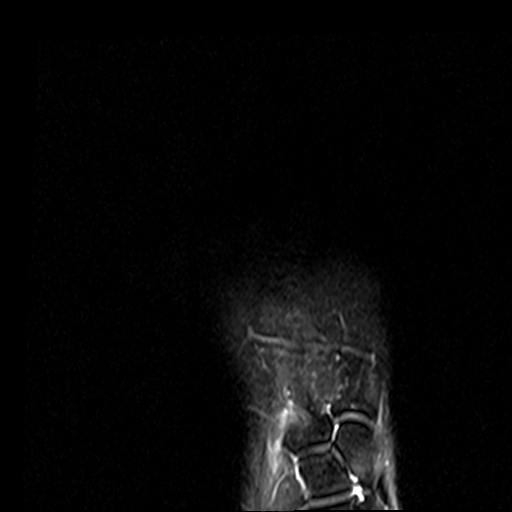
[im 23/23]
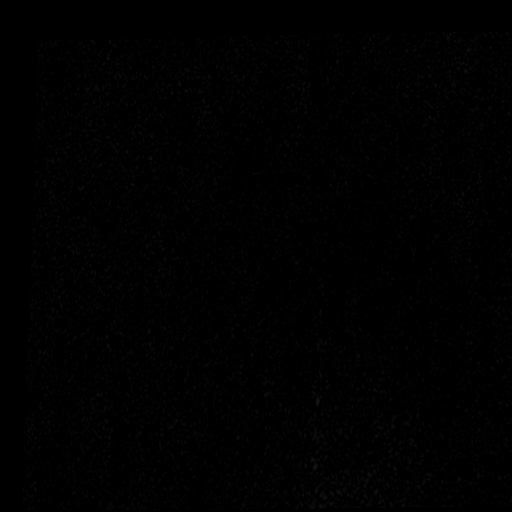

[Series 7: STIR · sagittal · 3.0mm · 0.31mm/px · 2 of 32 slices shown]
[im 1/32]
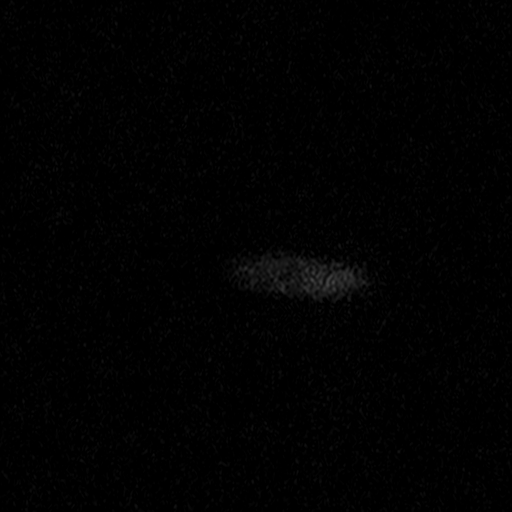
[im 5/32]
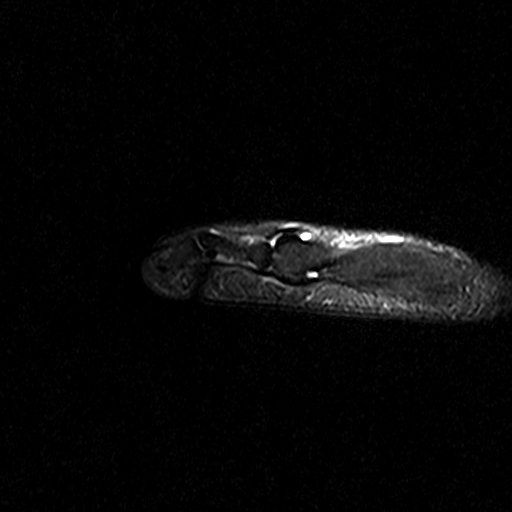

[30 of 40 positions shown; findings below may reference images not displayed]

FINDINGS: Bones/Joint/Cartilage

There is no acute fracture or dislocation. There is no aggressive
osseous lesion.

Ligaments

The Lisfranc ligament is intact. MTP collateral ligaments are
intact. There is no evidence of plantar plate tear.

Muscles and Tendons

No significant muscle atrophy or muscle edema. No acute tendon tear
in the forefoot.

Soft tissues

Along the plantar aspect of the great toe, there is masslike soft
tissue thickening with heterogeneous low T2 and intermediate low T1
signal and internal low signal intensity bands. This area measures
approximately 2.9 x 1.1 x 3.1 cm (axial T1 image 32, sagittal STIR
image 27). This abuts the flexor hallucis tendon. There is no
visible fat plane between this mass in the underlying tendon. There
is no evidence of underlying bony involvement.
IMPRESSION: Masslike soft tissue thickening along the plantar aspect of the
great toe, area measuring 2.9 x 1.1 x 3.1 cm. This is favored to
represent a fibroma/fibromatosis.

## 2021-10-04 ENCOUNTER — Telehealth: Payer: Self-pay | Admitting: Family Medicine

## 2021-10-04 DIAGNOSIS — R2241 Localized swelling, mass and lump, right lower limb: Secondary | ICD-10-CM

## 2021-10-04 DIAGNOSIS — M79674 Pain in right toe(s): Secondary | ICD-10-CM

## 2021-10-04 NOTE — Telephone Encounter (Signed)
Referral to podiatry ordered today

## 2021-10-04 NOTE — Progress Notes (Signed)
The MRI shows a masslike soft tissue thickening that think is a fibroma or a fibromatosis.  I have referred to podiatry now.  ?You should hear soon from the podiatry office.

## 2021-10-13 ENCOUNTER — Other Ambulatory Visit: Payer: Self-pay

## 2021-10-13 ENCOUNTER — Encounter: Payer: Self-pay | Admitting: Podiatry

## 2021-10-13 ENCOUNTER — Ambulatory Visit: Payer: Medicaid Other | Admitting: Podiatry

## 2021-10-13 DIAGNOSIS — M722 Plantar fascial fibromatosis: Secondary | ICD-10-CM | POA: Diagnosis not present

## 2021-10-17 ENCOUNTER — Encounter: Payer: Self-pay | Admitting: Podiatry

## 2021-10-17 NOTE — Progress Notes (Signed)
?  Subjective:  ?Patient ID: Angie Sanchez, female    DOB: 02-13-1986,  MRN: [id number] ? ?Chief Complaint  ?Patient presents with  ? Toe Pain  ?  New Patient  Diagnosis: R22.41 (ICD-10-CM) - Mass of foot, right ?[id number] (ICD-10-CM) - Great toe pain, right Referring Provider: Lynne Leader S  ? ? ?36 y.o. female presents with the above complaint. History confirmed with patient.  She is referred to [place] by Dr. Georgina Snell from sports medicine for a mass in the toe of the right foot, she has had these in her hands and on her knee before that were drained and injected ? ?Objective:  ?Physical Exam: ?warm, good capillary refill, no trophic changes or ulcerative lesions, normal DP and PT pulses, normal sensory exam, and firm palpable mass plantar digital sulcus of great toe right foot, slightly tender to touch. ? ? ?IMPRESSION: ?Masslike soft tissue thickening along the plantar aspect of the ?great toe, area measuring 2.9 x 1.1 x 3.1 cm. This is favored to ?represent a fibroma/fibromatosis. ?  ?  ?Electronically Signed ?  By: Maurine Simmering M.D. ?  On: 10/02/2021 10:28 ?Assessment:  ? ?1. Plantar fibromatosis   ? ? ? ?Plan:  ?Patient was evaluated and treated and all questions answered. ? ?We discussed etiology and treatment options of plantar fibromas and fibromatosis.  Discussed injection therapy as well as surgical excision.  She would like to proceed with injection therapy would like to avoid surgery for this at this point and she has responded to well to these in other areas.  Discussed risk of recurrence.  Following sterile prep with Betadine the mass was injected directly with after digital block with 20 mg of Kenalog and 4 mg of dexamethasone.  She will return as needed for this or other issues ? ?Return if symptoms worsen or fail to improve.  ? ?

## 2021-12-05 ENCOUNTER — Telehealth: Payer: Self-pay | Admitting: Physician Assistant

## 2021-12-05 NOTE — Telephone Encounter (Signed)
Pt is requesting a referral to [facility] health out patient audiology, Ammie Ferrier  ?Pt states she saw [facility] provider about an ear issue previously and thinks her hearing is getting worse. ? ?Pt would like to not come in for an appointment. ? ?Please advise. ? ? ? ?

## 2021-12-06 ENCOUNTER — Other Ambulatory Visit: Payer: Self-pay

## 2021-12-06 DIAGNOSIS — H9313 Tinnitus, bilateral: Secondary | ICD-10-CM

## 2021-12-06 NOTE — Telephone Encounter (Signed)
Referral Placed. Pt aware. ?

## 2021-12-09 ENCOUNTER — Ambulatory Visit: Payer: Medicaid Other | Attending: Audiologist | Admitting: Audiologist

## 2021-12-09 DIAGNOSIS — H9313 Tinnitus, bilateral: Secondary | ICD-10-CM | POA: Insufficient documentation

## 2021-12-09 NOTE — Procedures (Signed)
?  [facility] ?[address] ?[place], [place]  [address] ?[phone] ? ?AUDIOLOGICAL  EVALUATION ? ?NAME: Angie Sanchez     ?DOB:   1986/01/24      ?MRN: [id number]                                                                                     ?DATE: 12/09/2021     ?REFERENT: Allwardt, Alyssa M, PA-C ?STATUS: Outpatient ?DIAGNOSIS: Tinnitus   ? ?History: ?Deann was seen for an audiological evaluation due to feeling her hearing has worsened. Naveah has a seven year history of severe tinnitus. She had a period of extreme stress where the tinnitus was elevated. She has been seen by the [facility] Tinnitus clinic, they have fit her with hearing aids that provide masking and mild amplification for tinnitus relief. Kristel says it has been difficult to get used to the hearing aids as they make her anxious. Emmalina has history of significant anxiety. She is using exercise and other daily habits to help control the anxiety. She does not want to use medication. Melvie has no pain or pressure in either ear today. She wants her hearing tested. She does not feel she needs a tinnitus assessment as she has already received all that testing with the local specialist.  ? ? ?Evaluation:  ?Otoscopy showed a clear view of the tympanic membranes, bilaterally ?Tympanometry results were consistent with normal middle ear function, bilaterally   ?Audiometric testing was completed using Conventional Audiometry techniques with high frequency supraural headphones and a pulsed pure tone. Test results are consistent with normal hearing in each ear. Speech Recognition Thresholds were obtained at  15dB HL in the right ear and at  15dB HL in the left ear. Word Recognition Testing was completed at 55dB HL and Meka scored 100% in each ear with recorded NU6 list.   ? ?Results:  ?The test results were reviewed with Vernona Rieger. She has normal hearing in each ear. The tinnitus is likely associated with her anxiety and  panic attacks. Recommend continuing to manage tinnitus as recommended by Dr. Cammy Copa. Also recommend managing anxiety to decrease tinnitus symptoms. Arizbeth has strategies in place and does not want to try using medication as this time.  ? ?Recommendations: ?1.   Follow tinnitus management recommendations made by Jacinto Halim at [facility]. Use hearing aid masking as necessary and use strategies to minimize anxiety.  ? ? 25 minutes spent testing and counseling on results.  ? ?If you have any questions please feel free to contact me at [phone]. ? ?Ammie Ferrier Au.D.  ?Audiologist   ?12/09/2021  8:28 AM ? ?Cc: Allwardt, Crist Infante, PA-C ? ?

## 2021-12-09 NOTE — Progress Notes (Signed)
Please schedule Anxiety f/u with pt. ? ?Thanks! ?

## 2021-12-12 ENCOUNTER — Telehealth: Payer: Self-pay | Admitting: Physician Assistant

## 2021-12-12 NOTE — Telephone Encounter (Signed)
FYI

## 2021-12-12 NOTE — Telephone Encounter (Signed)
I called pt to schedule a follow up for her anxiety but she stated she did not need an appt. She stated she has already spoken with Alyssa and she is doing fine.  ?

## 2021-12-12 NOTE — Telephone Encounter (Signed)
Noted, thank you

## 2022-03-08 ENCOUNTER — Encounter (INDEPENDENT_AMBULATORY_CARE_PROVIDER_SITE_OTHER): Payer: Self-pay

## 2022-04-24 ENCOUNTER — Encounter: Payer: Self-pay | Admitting: *Deleted

## 2022-05-02 ENCOUNTER — Ambulatory Visit: Payer: Self-pay

## 2022-05-02 ENCOUNTER — Ambulatory Visit: Payer: Medicaid Other | Admitting: Family Medicine

## 2022-05-02 VITALS — BP 124/84 | HR 82 | Ht 61.0 in | Wt 174.0 lb

## 2022-05-02 DIAGNOSIS — M79641 Pain in right hand: Secondary | ICD-10-CM | POA: Diagnosis not present

## 2022-05-02 NOTE — Progress Notes (Signed)
   I, Angie Sanchez, LAT, ATC acting as a scribe for Angie Leader, MD.  Angie Sanchez is a 36 y.o. female who presents to [place] at [facility] today for R 3rd finger pain. Pt was previously seen by Dr. Georgina Snell on 09/28/21 for R Great toe pain. Today, pt c/o R 3rd finger pain x 3 weeks to 1 month. Pt locates pain to the IP and DIP of the R 3rd finger w/ some radiating pain into the volar aspect of her R hand. Pt is R-hand dominate. Pt c/o stiffness and difficulty flexing the finger.   Swelling: no Grip strength: diminished Paresthesia: no Aggravates: pick up things, gripping Treatments tried: stretching,    Pertinent review of systems: No fevers or chills  Relevant historical information: Otherwise healthy.  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  Children range from 56-1/2 years old to 31 years old.   Exam:  BP 124/84   Pulse 82   Ht 5\' 1"  (1.549 m)   Wt 174 lb (78.9 kg)   SpO2 98%   BMI 32.88 kg/m  General: Well Developed, well nourished, and in no acute distress.   MSK: Right hand normal-appearing Tenderness to palpation third PIP and DIP. Slight decreased range of motion to flexion of PIP and DIP third digit.  No triggering present with flexion of joints. Strength is intact. Pulses cap refill and sensation are intact distally.    Lab and Radiology Results  Diagnostic Limited MSK Ultrasound of: Right hand third digit MCP: Slight hypoechoic change around tendon sheath at A1 pulley consistent with mild tenosynovitis or early developing trigger finger. PIP: Mild joint effusion present. DIP normal-appearing Impression: Mild joint effusion third PIP and mild early tenosynovitis flexor tendon at MCP      Assessment and Plan: 36 y.o. female with right hand pain at third digit thought to be due to tendinitis or synovitis of the joints.  Plan for trial of hand therapy and Voltaren gel.  Check back in 6 weeks.  If not improving consider  further work-up and evaluation and potentially even injections.   PDMP not reviewed this encounter. Orders Placed This Encounter  Procedures   [place] LIMITED JOINT SPACE STRUCTURES UP RIGHT(NO LINKED CHARGES)    Order Specific Question:   Reason for Exam (SYMPTOM  OR DIAGNOSIS REQUIRED)    Answer:   right hand pain    Order Specific Question:   Preferred imaging location?    Answer:   [place]   Ambulatory referral to Physical Therapy    Referral Priority:   Routine    Referral Type:   Physical Medicine    Referral Reason:   Specialty Services Required    Requested Specialty:   Physical Therapy    Number of Visits Requested:   1   No orders of the defined types were placed in this encounter.    Discussed warning signs or symptoms. Please see discharge instructions. Patient expresses understanding.   The above documentation has been reviewed and is accurate and complete Angie Sanchez, M.D.

## 2022-05-02 NOTE — Patient Instructions (Addendum)
Thank you for coming in today.   I've referred you to Physical Therapy.  Let us know if you don't hear from them in one week.   Please use Voltaren gel (Generic Diclofenac Gel) up to 4x daily for pain as needed.  This is available over-the-counter as both the name brand Voltaren gel and the generic diclofenac gel.   Recheck in 6 weeks.  

## 2022-06-15 NOTE — Progress Notes (Signed)
   I, Philbert Riser, LAT, ATC acting as a scribe for Clementeen Graham, MD.  Angie Sanchez is a 36 y.o. female who presents to [facility] at [facility] today for f/u R 3rd finger pain. Pt is R-hand dominate. Pt was last seen by Dr. Denyse Amass on 05/02/22 and was advised to use Voltaren gel and was referred to [facility] for hand therapy. Today, pt reports insurance didn't cover hand therapy, but she has been doing some HEP on her own. Pt feel like her finger is improving somewhat, but will intermittently feel "stiff."    Pertinent review of systems: No fevers or chills  Relevant historical information: Tinnitus   Exam:  BP 118/80   Pulse 84   Ht 5\' 1"  (1.549 m)   Wt 177 lb (80.3 kg)   SpO2 97%   BMI 33.44 kg/m  General: Well Developed, well nourished, and in no acute distress.   MSK: Right hand normal.  Normal motion normal strength.  No triggering present.     Assessment and Plan: 36 y.o. female with right hand tendinitis and synovitis third digit.  Doing well with significant improvement with home exercise program. Can proceed to occupational therapy if needed.  She has Medicaid so she has some trouble getting into the place I referred her most recently but I am confident we can find a hand therapist that would see her with her insurance.  Additionally we could do an injection if needed.  Fortunately she is doing well enough that we do not need to do any of these interventions at this time.  Check back as needed.  Total encounter time 20 minutes including face-to-face time with the patient and, reviewing past medical record, and charting on the date of service.      Discussed warning signs or symptoms. Please see discharge instructions. Patient expresses understanding.   The above documentation has been reviewed and is accurate and complete 20, M.D.

## 2022-06-16 ENCOUNTER — Ambulatory Visit: Payer: Medicaid Other | Admitting: Family Medicine

## 2022-06-16 VITALS — BP 118/80 | HR 84 | Ht 61.0 in | Wt 177.0 lb

## 2022-06-16 DIAGNOSIS — M79641 Pain in right hand: Secondary | ICD-10-CM | POA: Diagnosis not present

## 2022-06-16 NOTE — Patient Instructions (Signed)
Thank you for coming in today.   Let me know if I need to do more including a re-trial of OT referral to a different location or even an injection.

## 2022-07-13 ENCOUNTER — Encounter: Payer: Self-pay | Admitting: *Deleted

## 2022-11-15 ENCOUNTER — Ambulatory Visit: Payer: Medicaid Other | Admitting: Podiatry

## 2022-11-15 DIAGNOSIS — M722 Plantar fascial fibromatosis: Secondary | ICD-10-CM | POA: Diagnosis not present

## 2022-11-16 NOTE — Progress Notes (Signed)
  Subjective:  Patient ID: Angie Sanchez, female    DOB: 1986/05/15,  MRN: [id number]  Chief Complaint  Patient presents with   Plantar Fasciitis    Flare up both feet  Cortisone shots in both feet.    37 y.o. female presents with the above complaint. History confirmed with patient.  Right foot improved quite a bit after the last injection.  Starting become tender again within the toe.  The left foot has a lesions as well outside the fifth toe that are also painful  Objective:  Physical Exam: warm, good capillary refill, no trophic changes or ulcerative lesions, normal DP and PT pulses, normal sensory exam, and firm palpable mass plantar digital sulcus of great toe right foot, slightly tender to touch.  2 separate lesions on left foot plantar lateral forefoot   IMPRESSION: Masslike soft tissue thickening along the plantar aspect of the great toe, area measuring 2.9 x 1.1 x 3.1 cm. This is favored to represent a fibroma/fibromatosis.     Electronically Signed   By: Caprice Renshaw M.D.   On: 10/02/2021 10:28 Assessment:   1. Plantar fibromatosis      Plan:  Patient was evaluated and treated and all questions answered.  We again discussed etiology and treatment options of plantar fibromas and fibromatosis.  She has done well with previous injections and would like to proceed again with this today.  Discussed risk of recurrence.  Following sterile prep with alcohol the masses on both feet were separately injected directly with 20 mg of Kenalog and 4 mg of dexamethasone.  She will return as needed for this or other issues  Return if symptoms worsen or fail to improve.

## 2023-10-22 ENCOUNTER — Telehealth: Payer: Self-pay | Admitting: Physician Assistant

## 2023-10-22 ENCOUNTER — Ambulatory Visit: Payer: Self-pay | Admitting: *Deleted

## 2023-10-22 NOTE — Telephone Encounter (Signed)
 Please see triage note; unable to contact patient

## 2023-10-22 NOTE — Telephone Encounter (Signed)
 Third attempt to reach patient- same recording: Call can not be completed as dialed"  Will forward call to office

## 2023-10-22 NOTE — Telephone Encounter (Addendum)
  Attempted to contact patient- x2- message"call can not be completed as dialed" . Unable to leave call back message   Copied from CRM [id number]. Topic: Clinical - Red Word Triage >> Oct 22, 2023  8:14 AM Angie Sanchez wrote: Red Word that prompted transfer to Nurse Triage: Patient stated she has been breaking out in a rash for about 8-9 days, rash is on both arms and now moving towards your face.

## 2023-10-22 NOTE — Telephone Encounter (Unsigned)
 Copied from CRM [id number]. Topic: Appointments - Scheduling Inquiry for Clinic >> Oct 22, 2023  8:32 AM Gurney Maxin H wrote: Reason for CRM: Patient would like to make Dr. Caryl Never her primary care, states husband see's him and wants to switch over and make him her PCP, patient states she isn't happy with other office.   Angie Sanchez [phone]

## 2023-10-22 NOTE — Telephone Encounter (Signed)
 Second attempted to contact patient- same message-"call can not be completed as dialed"

## 2023-10-24 ENCOUNTER — Encounter: Payer: Self-pay | Admitting: Physician Assistant

## 2023-10-24 ENCOUNTER — Other Ambulatory Visit (HOSPITAL_COMMUNITY): Payer: Self-pay

## 2023-10-24 ENCOUNTER — Other Ambulatory Visit: Payer: Self-pay | Admitting: Physician Assistant

## 2023-10-24 ENCOUNTER — Ambulatory Visit: Admitting: Physician Assistant

## 2023-10-24 VITALS — BP 120/80 | HR 85 | Temp 98.4°F | Ht 61.0 in | Wt 183.4 lb

## 2023-10-24 DIAGNOSIS — R21 Rash and other nonspecific skin eruption: Secondary | ICD-10-CM | POA: Diagnosis not present

## 2023-10-24 MED ORDER — PREDNISONE 10 MG PO TABS: ORAL_TABLET | ORAL | 0 refills | Status: DC

## 2023-10-24 NOTE — Telephone Encounter (Signed)
 Pt will be sch for 11/26/2023

## 2023-10-24 NOTE — Telephone Encounter (Unsigned)
 Copied from CRM [id number]. Topic: Clinical - Prescription Issue >> Oct 24, 2023  3:12 PM Tiffany S wrote: Reason for CRM: Patient stated [facility] is unable fill prescription patient asked if we can send it to [facility]  [address] [place] [address] [phone]

## 2023-10-24 NOTE — Progress Notes (Signed)
 Angie Sanchez is a 38 y.o. female here for a new problem.  History of Present Illness:   Chief Complaint  Patient presents with   Rash    Pt c/o rash on arms, legs and face, started x 1 week ago, itching, has tried Triamcinolone cream little relief.   Rash  Patient complains of a rash that has persisted for the past week. Rash is located over arms, legs, and face.  Rash started as small bumpy rashes on her wrist and spread to the other regions.   Associated symptoms include itchiness.  Reports using Triamcinolone along with benadryl and daily Claritin 10 mg. She also reports using Calmoseptine cream with minimal relief.  Denies any recent travel, history of eczema or rashes or known triggers.   Patient does states that she often experience's dried skin which she attributes to washing her hands often.  Does states that she was on multiple courses of antibiotics and prednisone in the past several months.    Past Medical History:  Diagnosis Date   Anemia    H/O varicella    Headache(784.0)    Tinnitus    since 2015   Urinary tract infection      Social History   Tobacco Use   Smoking status: Never   Smokeless tobacco: Never  Vaping Use   Vaping status: Never Used  Substance Use Topics   Alcohol use: Not Currently    Comment: rarely   Drug use: No    Past Surgical History:  Procedure Laterality Date   nodule removal     from knee fingers and toes   OTHER SURGICAL HISTORY  2001-2002   foot, knee, and finger (granulomas)   WISDOM TOOTH EXTRACTION      Family History  Problem Relation Age of Onset   Arthritis Maternal Grandmother    Hypertension Maternal Grandmother    Arthritis Maternal Grandfather    Diabetes Maternal Grandfather    Heart disease Maternal Grandfather    Hypertension Maternal Grandfather    Depression Maternal Grandfather    Hypertension Mother    Depression Mother    Cancer Mother        pancreatic   Hyperlipidemia Father    Depression  Father    Hypertension Father    Cancer Cousin        breast   Hypertension Paternal Grandmother    Parkinson's disease Paternal Grandmother     Allergies  Allergen Reactions   Other Other (See Comments)    Current Medications:   Current Outpatient Medications:    acetaminophen (TYLENOL) 500 MG tablet, Take 1,000 mg by mouth every 6 (six) hours as needed for headache., Disp: , Rfl:    albuterol (VENTOLIN HFA) 108 (90 Base) MCG/ACT inhaler, Ventolin HFA 90 mcg/actuation aerosol inhaler, Disp: , Rfl:    ibuprofen (ADVIL,MOTRIN) 600 MG tablet, Take 1 tablet (600 mg total) by mouth every 6 (six) hours as needed for moderate pain., Disp: 30 tablet, Rfl: 0   loratadine (CLARITIN) 10 MG tablet, Take 10 mg by mouth daily as needed for allergies. , Disp: , Rfl:    Review of Systems:   Review of Systems  Skin:  Positive for itching and rash.  Negative unless otherwise specified per HPI.  Vitals:   Vitals:   10/24/23 1136  BP: 120/80  Pulse: 85  Temp: 98.4 F (36.9 C)  TempSrc: Temporal  SpO2: 97%  Weight: 183 lb 6.1 oz (83.2 kg)  Height: 5\' 1"  (1.549 m)  Body mass index is 34.65 kg/m.  Physical Exam:   Physical Exam Constitutional:      Appearance: Normal appearance. She is well-developed.  HENT:     Head: Normocephalic and atraumatic.  Eyes:     General: Lids are normal.     Extraocular Movements: Extraocular movements intact.     Conjunctiva/sclera: Conjunctivae normal.  Pulmonary:     Effort: Pulmonary effort is normal.  Musculoskeletal:        General: Normal range of motion.     Cervical back: Normal range of motion and neck supple.  Skin:    General: Skin is warm and dry.     Comments: Scattered erythematous papules on b/l hands/arms/torso  Neurological:     Mental Status: She is alert and oriented to person, place, and time.  Psychiatric:        Attention and Perception: Attention and perception normal.        Mood and Affect: Mood normal.         Behavior: Behavior normal.        Thought Content: Thought content normal.        Judgment: Judgment normal.     Assessment and Plan:   Rash and nonspecific skin eruption Unclear etiology Due to widespread area - feel best to treat with systemic steroids Start prednisone 20 mg daily x 1 week then 10 mg daily x 1 week Start Pepcid Continue claritin Denies anaphylaxis symptom(s) or need for epi-pen Consider allergy referral -- she will let us know if she would like this If lack of improvement, recommend close follow up    Jarold Motto, PA-C  I,Safa M Kadhim,acting as a scribe for Jarold Motto, PA.,have documented all relevant documentation on the behalf of Jarold Motto, PA,as directed by  Jarold Motto, PA while in the presence of Jarold Motto, [place].   I, Jarold Motto, [place], have reviewed all documentation for this visit. The documentation on 10/24/23 for the exam, diagnosis, procedures, and orders are all accurate and complete.

## 2023-10-25 ENCOUNTER — Encounter: Payer: Self-pay | Admitting: Physician Assistant

## 2023-10-25 ENCOUNTER — Other Ambulatory Visit: Payer: Self-pay | Admitting: Physician Assistant

## 2023-10-25 MED ORDER — PREDNISONE 10 MG PO TABS: ORAL_TABLET | ORAL | 0 refills | Status: DC

## 2023-10-25 NOTE — Telephone Encounter (Signed)
 Copied from CRM [id number]. Topic: Clinical - Red Word Triage >> Oct 25, 2023  8:12 AM Elizebeth Brooking wrote: [facility] that prompted transfer to Nurse Triage: Patient came in yesterday because she had a rash , was prescribed medication sent to the [facility], they stated they didn't have it in stock so she wanted it sent to the [facility] and haven't gotten any response about it. Patient is very uncomfortable and can not wait all day to get prescription would like to get something to get some relief as soon as possible.

## 2023-11-13 NOTE — Telephone Encounter (Signed)
**Note De-identified  [place] Obfuscation** Please advise 

## 2023-11-16 ENCOUNTER — Other Ambulatory Visit: Payer: Self-pay | Admitting: Physician Assistant

## 2023-11-16 DIAGNOSIS — R21 Rash and other nonspecific skin eruption: Secondary | ICD-10-CM

## 2023-11-19 ENCOUNTER — Telehealth: Payer: Self-pay | Admitting: Physician Assistant

## 2023-11-19 NOTE — Telephone Encounter (Signed)
 Copied from CRM [id number]. Topic: Referral - Request for Referral >> Nov 16, 2023  2:18 PM Abigail D wrote: Did the patient discuss referral with their provider in the last year? Yes (If No - schedule appointment) (If Yes - send message)  Appointment offered? No  Type of order/referral and detailed reason for visit: Allergist and Dermatologist  Preference of office, provider, location: Dermatologist: Louana Roup, DO - [facility] Allergist: Brislyn, Saylor, PA-C - [facility]   If referral order, have you been seen by this specialty before? No (If Yes, this issue or another issue? When? Where?  Can we respond through MyChart? Yes

## 2023-11-20 ENCOUNTER — Other Ambulatory Visit: Payer: Self-pay

## 2023-11-20 DIAGNOSIS — R21 Rash and other nonspecific skin eruption: Secondary | ICD-10-CM

## 2023-11-20 NOTE — Telephone Encounter (Signed)
 Referrals placed for patient to specific locations and providers requested

## 2023-11-26 ENCOUNTER — Encounter: Payer: Self-pay | Admitting: Family Medicine

## 2023-11-26 ENCOUNTER — Ambulatory Visit: Admitting: Family Medicine

## 2023-11-26 VITALS — BP 124/84 | HR 96 | Temp 98.4°F | Wt 182.5 lb

## 2023-11-26 DIAGNOSIS — F419 Anxiety disorder, unspecified: Secondary | ICD-10-CM

## 2023-11-26 DIAGNOSIS — H9313 Tinnitus, bilateral: Secondary | ICD-10-CM | POA: Diagnosis not present

## 2023-11-26 NOTE — Progress Notes (Unsigned)
 Established Patient Office Visit  Subjective   Patient ID: Angie Sanchez, female    DOB: 1985-12-08  Age: 39 y.o. MRN: [id number]  Chief Complaint  Patient presents with   Transitions Of Care    HPI  {History (Optional):23778} Angie Sanchez is seen to establish care.  I have seen her husband as a patient for several years.  She is generally fairly healthy.  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  She states she has seen several ENT specialist in the past.  She wonders if some of this may be "hormonal ".  She is also been seen at the [facility] hearing clinic.  She has been tried on a device to try to muffle her tinnitus but currently not using that.  She feels like there is quite a bit of anxiety possibly stemming from her tinnitus.  Her symptoms are quite disabling at times.  She has never had any associated vertigo.  Has been tested by audiologist but no significant hearing loss.  Does have frequent anxiety symptoms.  She suspects panic attacks about 2-3 times per month.  Can come on unpredictably in last 20 minutes to 2 hours.  Sometimes has heart racing up to 140s.  This seems to follow the anxiety symptoms.  She has been very reluctant to take any kind of medications for prevention.  She noted particularly stressful year 12/25/17 when her mom died age 52 of pancreatic cancer.  She also delivered her fourth child that year.  She states back in March she had bilateral vesicular rash initially hands and arms and then eventually spread to the ankles and face.  Was treated with 2 rounds of prednisone  and this eventually cleared.  She brings in a picture and this looks like a contact dermatitis.  She is overweight and has struggled to lose weight.  She has tried monitoring calories and lower glycemic diets along with some exercise without much success.  Past Medical History:  Diagnosis Date   Anemia    H/O varicella    Headache(784.0)    Tinnitus    since 2013/12/25    Urinary tract infection    Past Surgical History:  Procedure Laterality Date   nodule removal     from knee fingers and toes   OTHER SURGICAL HISTORY  2001-2002   foot, knee, and finger (granulomas)   WISDOM TOOTH EXTRACTION      reports that she has never smoked. She has never used smokeless tobacco. She reports that she does not currently use alcohol. She reports that she does not use drugs. family history includes Arthritis in her maternal grandfather and maternal grandmother; Cancer in her cousin and mother; Depression in her father, maternal grandfather, and mother; Diabetes in her maternal grandfather; Heart disease in her maternal grandfather; Hyperlipidemia in her father; Hypertension in her father, maternal grandfather, maternal grandmother, mother, and paternal grandmother; Parkinson's disease in her paternal grandmother. Allergies  Allergen Reactions   Other Other (See Comments)    Review of Systems  Constitutional:  Negative for malaise/fatigue.  HENT:  Positive for tinnitus. Negative for congestion, ear discharge, ear pain and hearing loss.   Eyes:  Negative for blurred vision.  Respiratory:  Negative for shortness of breath.   Cardiovascular:  Negative for chest pain.  Neurological:  Negative for dizziness, weakness and headaches.      Objective:     BP 124/84 (BP Location: Left Arm, Cuff Size: Normal)   Pulse 96  Temp 98.4 F (36.9 C) (Oral)   Wt 182 lb 8 oz (82.8 kg)   SpO2 98%   BMI 34.48 kg/m  BP Readings from Last 3 Encounters:  11/26/23 124/84  10/24/23 120/80  06/16/22 118/80   Wt Readings from Last 3 Encounters:  11/26/23 182 lb 8 oz (82.8 kg)  10/24/23 183 lb 6.1 oz (83.2 kg)  06/16/22 177 lb (80.3 kg)      Physical Exam Vitals reviewed.  Constitutional:      General: She is not in acute distress.    Appearance: She is not ill-appearing.  HENT:     Right Ear: Tympanic membrane normal.     Left Ear: Tympanic membrane normal.   Cardiovascular:     Rate and Rhythm: Normal rate and regular rhythm.  Pulmonary:     Effort: Pulmonary effort is normal.     Breath sounds: Normal breath sounds. No wheezing or rales.  Skin:    Comments: Forearms examined.  Generally dry skin but no specific rash otherwise.  Neurological:     General: No focal deficit present.     Mental Status: She is alert.      No results found for any visits on 11/26/23.  {Labs (Optional):23779}  The 10-year ASCVD risk score (Arnett DK, et al., 2019) is: 0.4%    Assessment & Plan:   #1 chronic bilateral tinnitus.  She apparently has seen multiple ENTs and audiologist previously over the years with no clear etiology found.  She has had some type of blunting device but did not see much improvement.  Had a lot of anxiety related to the symptoms but is reluctant to consider antianxiety medications.  Has had some cognitive behavioral therapy previously.  He might be receptive to seeing another ENT with expertise in tinnitus  #2 history of anxiety symptoms with probable panic attacks.  We discussed potential treatment with medication such as SSRI but she is reluctant.  She is currently having about 2 to 3/month.  She has been given some cognitive behavioral therapy to try to help cope with symptoms during flareups.  She will let us  know if she change her mind regarding SSRI trial  #3 recent skin rash.  The picture from her phone look like probably contact dermatitis.  Does have generally dry skin.  Discussed daily regular use of moisturizer especially post bathing   Glean Lamy, MD

## 2023-12-04 ENCOUNTER — Encounter: Payer: Self-pay | Admitting: Family Medicine

## 2023-12-04 ENCOUNTER — Ambulatory Visit: Admitting: Family Medicine

## 2023-12-04 VITALS — BP 142/80 | HR 103 | Temp 97.7°F | Ht 61.0 in | Wt 181.8 lb

## 2023-12-04 DIAGNOSIS — R21 Rash and other nonspecific skin eruption: Secondary | ICD-10-CM

## 2023-12-04 MED ORDER — TRIAMCINOLONE ACETONIDE 0.1 % EX CREA: 1.0000 | TOPICAL_CREAM | Freq: Two times a day (BID) | CUTANEOUS | 1 refills | Status: AC

## 2023-12-04 NOTE — Progress Notes (Signed)
 Established Patient Office Visit  Subjective   Patient ID: Angie Sanchez, female    DOB: 26-Mar-1986  Age: 38 y.o. MRN: [id number]  Chief Complaint  Patient presents with   Rash    Patient came in today for a rash on her leg and feet that started 2 months ago, red bumps     HPI   [name] is seen with some recurrent pruritic rash on lower legs bilaterally.  She has had some similar rash off and on for couple months.  Had gotten prednisone  from previous practice which did help temporarily but each time she is come all she has had recurrent rash.  She has several small pruritic papules.  No pustules.  Possibly worse after being out in the yard.  Most of her lesions are confined to her legs and knee region.  She prefers to try to avoid further oral steroids.  She has been using some Claritin without much relief.  She uses Benadryl  at night but has significant side effects.  Past Medical History:  Diagnosis Date   Anemia    H/O varicella    Headache(784.0)    Tinnitus    since 2015   Urinary tract infection    Past Surgical History:  Procedure Laterality Date   nodule removal     from knee fingers and toes   OTHER SURGICAL HISTORY  2001-2002   foot, knee, and finger (granulomas)   WISDOM TOOTH EXTRACTION      reports that she has never smoked. She has never used smokeless tobacco. She reports that she does not currently use alcohol. She reports that she does not use drugs. family history includes Arthritis in her maternal grandfather and maternal grandmother; Cancer in her cousin and mother; Depression in her father, maternal grandfather, and mother; Diabetes in her maternal grandfather; Heart disease in her maternal grandfather; Hyperlipidemia in her father; Hypertension in her father, maternal grandfather, maternal grandmother, mother, and paternal grandmother; Parkinson's disease in her paternal grandmother. Allergies  Allergen Reactions   Other Other (See Comments)     Review of Systems  Constitutional:  Negative for chills and fever.  Skin:  Positive for itching and rash.      Objective:     BP (!) 142/80 (BP Location: Left Arm, Patient Position: Sitting, Cuff Size: Normal)   Pulse (!) 103   Temp 97.7 F (36.5 C) (Oral)   Ht 5\' 1"  (1.549 m)   Wt 181 lb 12.8 oz (82.5 kg)   LMP  (LMP Unknown)   SpO2 97%   BMI 34.35 kg/m    Physical Exam Vitals reviewed.  Constitutional:      General: She is not in acute distress.    Appearance: She is not ill-appearing.  Cardiovascular:     Rate and Rhythm: Normal rate and regular rhythm.  Skin:    Findings: Rash present.     Comments: Scattered rash on both lower extremities mostly involving the legs.  Lesions are fairly discrete 1 to 2 mm slightly raised areas with very punctate center.  No pustule.  A few are excoriated.  Neurological:     Mental Status: She is alert.      No results found for any visits on 12/04/23.    The 10-year ASCVD risk score (Arnett DK, et al., 2019) is: 0.5%    Assessment & Plan:   Skin rash lower legs.  Suspect some type of insect bite most likely given their appearance.  We have recommended  nonsedating antihistamine such as Allegra, Xyzal, or Zyrtec.  May continue Benadryl  at night.  Also triamcinolone  0.1% cream to use twice daily as needed.  Consider keeping legs covered when out in yard  Glean Lamy, MD

## 2023-12-04 NOTE — Patient Instructions (Signed)
 Consider over the counter anti-histamine such as Zyrtec, Xyzal, or Allegra  May also add Pepcid 20 mg once or twice daily.

## 2023-12-17 ENCOUNTER — Other Ambulatory Visit: Payer: Self-pay | Admitting: Family Medicine

## 2023-12-17 DIAGNOSIS — H9313 Tinnitus, bilateral: Secondary | ICD-10-CM

## 2023-12-17 NOTE — Progress Notes (Signed)
 Patient been battling chronic tinnitus bilaterally for over 10 years.  Has not gotten any relief thus far.  Will set up with [facility]-if this is covered by her insurance  Angie Sit MD [facility]

## 2024-01-02 ENCOUNTER — Other Ambulatory Visit: Payer: Self-pay

## 2024-01-02 ENCOUNTER — Encounter: Payer: Self-pay | Admitting: Allergy

## 2024-01-02 ENCOUNTER — Ambulatory Visit: Admitting: Allergy

## 2024-01-02 VITALS — BP 128/86 | HR 85 | Temp 98.7°F | Resp 18 | Ht 61.81 in | Wt 179.3 lb

## 2024-01-02 DIAGNOSIS — B999 Unspecified infectious disease: Secondary | ICD-10-CM

## 2024-01-02 DIAGNOSIS — J3089 Other allergic rhinitis: Secondary | ICD-10-CM | POA: Diagnosis not present

## 2024-01-02 DIAGNOSIS — J4520 Mild intermittent asthma, uncomplicated: Secondary | ICD-10-CM | POA: Diagnosis not present

## 2024-01-02 DIAGNOSIS — R21 Rash and other nonspecific skin eruption: Secondary | ICD-10-CM | POA: Diagnosis not present

## 2024-01-02 DIAGNOSIS — H6993 Unspecified Eustachian tube disorder, bilateral: Secondary | ICD-10-CM

## 2024-01-02 MED ORDER — XHANCE 93 MCG/ACT NA EXHU: INHALANT_SUSPENSION | NASAL | 5 refills | Status: AC

## 2024-01-02 NOTE — Patient Instructions (Addendum)
 Rash  I'm not sure what triggered this episode.  Keep track of rashes and take pictures. Write down what you had done/eaten during flares.  See below for proper skin care. Use fragrance free and dye free products. No dryer sheets or fabric softener.  Consider patch testing and/or bloodwork in the future.    Environmental allergies Return for allergy skin testing. Will make additional recommendations based on results. If significant positives will recommend allergy injections - handout given. Make sure you don't take any antihistamines for 3 days before the skin testing appointment. Don't put any lotion on the back and arms on the day of testing.  Plan on being here for 30-60 minutes.   Start Xhance  (fluticasone ) nasal spray 1-2 sprays per nostril twice a day as needed for nasal congestion.  Sample given and demonstrated proper use. If this is not covered let us  know.  This will be mailed to you from [facility] pharmacy [phone])  Use azelastine nasal spray 1-2 sprays per nostril twice a day as needed for runny nose/drainage. Stop 3 days before skin testing.   Eustachian tube dysfunction/tinnitus Recommend seeing ENT again if no improvement with above regimen.   Infections Keep track of infections and antibiotics use. If persistent will get bloodwork next to look at immune system.   Breathing Monitor symptoms. May use albuterol rescue inhaler 2 puffs every 4 to 6 hours as needed for shortness of breath, chest tightness, coughing, and wheezing. May use albuterol rescue inhaler 2 puffs 5 to 15 minutes prior to strenuous physical activities. Monitor frequency of use - if you need to use it more than twice per week on a consistent basis let us  know.   Follow up for skin testing.  Skin care recommendations  Bath time: Always use lukewarm water. AVOID very hot or cold water. Keep bathing time to 5-10 minutes. Do NOT use bubble bath. Use a mild soap and use just enough to wash the  dirty areas. Do NOT scrub skin vigorously.  After bathing, pat dry your skin with a towel. Do NOT rub or scrub the skin.  Moisturizers and prescriptions:  ALWAYS apply moisturizers immediately after bathing (within 3 minutes). This helps to lock-in moisture. Use the moisturizer several times a day over the whole body. Good summer moisturizers include: Aveeno, CeraVe, Cetaphil. Good winter moisturizers include: Aquaphor, Vaseline, Cerave, Cetaphil, Eucerin, Vanicream. When using moisturizers along with medications, the moisturizer should be applied about one hour after applying the medication to prevent diluting effect of the medication or moisturize around where you applied the medications. When not using medications, the moisturizer can be continued twice daily as maintenance.  Laundry and clothing: Avoid laundry products with added color or perfumes. Use unscented hypo-allergenic laundry products such as Tide free, Cheer free & gentle, and All free and clear.  If the skin still seems dry or sensitive, you can try double-rinsing the clothes. Avoid tight or scratchy clothing such as wool. Do not use fabric softeners or dyer sheets.

## 2024-01-02 NOTE — Progress Notes (Signed)
 New Patient Note  RE: Angie Sanchez MRN: [id number] DOB: Oct 02, 1985 Date of Office Visit: 01/02/2024  Consult requested by: Marquetta Sit, MD Primary care provider: Marquetta Sit, MD  Chief Complaint: Rash (Started in [place]. And lasted for a month - was given prednisone  helped until it ended ) and Other (Gustation tube - feeling fluid in her ears - frequent sinus and ear infections )  History of Present Illness: I had the pleasure of seeing Angie Sanchez for initial evaluation at the [facility] on 01/02/2024. She is a 38 y.o. female, who is referred here by Marquetta Sit, MD for the evaluation of rash.  Discussed the use of AI scribe software for clinical note transcription with the patient, who gave verbal consent to proceed.    She has been experiencing a recurrent rash that began in February. The rash initially responded to prednisone  but recurred five days after completing the course, eventually resolving in mid-April. It was itchy, erythematous, and raised, affecting her arms, face, legs, and knees. She used a friend's cream on her wrists, which provided temporary relief. She has had similar unexplained rashes in the past, though not for about ten years.  She has been dealing with ear pressure and popping, primarily in the right ear, for several years. The pressure has increased over the past month, with a fluid sensation in the ear for the last five days. She takes daily allergy medication and has tried Flonase  and azelastine nasal sprays. Diphenhydramine  seems to alleviate the ear pressure. She has a history of sinus and ear infections, often treated with antibiotics and prednisone .  Her family history includes children with severe allergies and asthma. She lives in a house with hardwood floors and has pets, including rabbits and dogs. She does not smoke and stays at home. No recent allergy testing or asthma diagnosis. Although she sometimes uses an  inhaler as needed during respiratory infections.  No recent fevers, chills, or significant hearing loss. Reports nasal congestion upon waking and postnasal drip.      Rash started in February 2025 and cleared up in April 2025. Mainly occurs on her arms, face, lower legs. Describes them as itchy, raised, blistering. Individual rashes lasted for more than 1 day. Associated symptoms include: none.  Frequency of episodes: daily. Suspected triggers are unknown.  She did have multiple ear infections a few months prior requiring steroids and antibiotics.  Denies any fevers, chills, foods, personal care products. She has tried the following therapies: prednisone  with good benefit. Systemic steroids: yes.  Previous history of rash/hives: sometimes has a rash with unknown triggers. Patient is up to date with the following cancer screening tests: physical exam. Not up to date with mammogram.  She reports symptoms of itchy throat, rhinorrhea, right sided ear fullness. Symptoms have been going on for at least 3-4 years. The symptoms are present all year around. Anosmia: no. Headache: sometimes. She has used antihistamines, Flonase , azelastine with minimal improvement in symptoms. Sinus infections: yes. Previous work up includes: none. Previous ENT evaluation: yes in the past for tinnitus. Last eye exam: last year.  History of reflux: sometimes.  12/04/2023 PCP visit: "Angie Sanchez is seen with some recurrent pruritic rash on lower legs bilaterally.  She has had some similar rash off and on for couple months.  Had gotten prednisone  from previous practice which did help temporarily but each time she is come all she has had recurrent rash.  She has several small  pruritic papules.  No pustules.  Possibly worse after being out in the yard.  Most of her lesions are confined to her legs and knee region.  She prefers to try to avoid further oral steroids.  She has been using some Claritin without much relief.  She uses  Benadryl  at night but has significant side effects. "  Assessment and Plan: Jasleen is a 38 y.o. female with: Rash and other nonspecific skin eruption Recurrent pruritic, erythematous rash resolved by mid-April. Previous prednisone  effective but rash recurred post-treatment. No clear trigger identified.  Unclear etiology. Keep track of rashes and take pictures. Write down what you had done/eaten during flares.  See below for proper skin care. Use fragrance free and dye free products. No dryer sheets or fabric softener.  Consider patch testing and/or bloodwork in the future.    Other allergic rhinitis Perennial symptoms and takes antihistamines daily. No prior testing.  Return for allergy skin testing (1-55). Will make additional recommendations based on results. If significant positives will recommend allergy injections - handout given.  Eustachian tube dysfunction, bilateral History of tinnitus and saw ENT in the past. Flonase  and azelastine minimally effective. Start Xhance  (fluticasone ) nasal spray 1-2 sprays per nostril twice a day as needed for nasal congestion.  Sample given and demonstrated proper use. If this is not covered let us  know.  This will be mailed to you from [facility] pharmacy [phone]) Use azelastine nasal spray 1-2 sprays per nostril twice a day as needed for runny nose/drainage.  Recurrent infections Frequent ear/sinus infections with pneumonias/bronchitis in the past. Keep track of infections and antibiotics use. If persistent will get bloodwork next to look at immune system.   Mild intermittent reactive airway disease Uses albuterol prn with during infections. Monitor symptoms. May use albuterol rescue inhaler 2 puffs every 4 to 6 hours as needed for shortness of breath, chest tightness, coughing, and wheezing. May use albuterol rescue inhaler 2 puffs 5 to 15 minutes prior to strenuous physical activities. Monitor frequency of use - if you need to use it  more than twice per week on a consistent basis let us  know.  Get spirometry at next visit.  Return for Skin testing.  Meds ordered this encounter  Medications   Fluticasone  Propionate (XHANCE ) 93 MCG/ACT EXHU    Sig: 1-2 sprays per nostril twice a day.    Dispense:  16 mL    Refill:  5   Lab Orders  No laboratory test(s) ordered today    Other allergy screening: Asthma:  Uses albuterol during infections.  Food allergy: no Medication allergy: no Hymenoptera allergy: no History of recurrent infections suggestive of immunodeficency: ear and sinus infections, pneumonia  Diagnostics: None.   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   Tinnitus of both ears 02/12/2019   Normal labor and delivery 08/24/2018   Post-dates pregnancy 08/24/2018   Positive GBS test 01/08/2015   LGA (large for gestational age) fetus 01/08/2015   Polyhydramnios in third trimester 01/08/2015   Rh negative, maternal 01/08/2015   SVD (spontaneous vaginal delivery) 01/08/2015   Past Medical History:  Diagnosis Date   Anemia    H/O varicella    Headache(784.0)    Recurrent upper respiratory infection (URI)    Tinnitus    since 2015   Urinary tract infection    Past Surgical History: Past Surgical History:  Procedure Laterality Date   nodule removal  from knee fingers and toes   OTHER SURGICAL HISTORY  2001-2002   foot, knee, and finger (granulomas)   WISDOM TOOTH EXTRACTION     Medication List:  Current Outpatient Medications  Medication Sig Dispense Refill   acetaminophen  (TYLENOL ) 500 MG tablet Take 1,000 mg by mouth every 6 (six) hours as needed for headache.     albuterol (VENTOLIN HFA) 108 (90 Base) MCG/ACT inhaler Ventolin HFA 90 mcg/actuation aerosol inhaler     azelastine (ASTELIN) 0.1 % nasal spray Place 1 spray into both nostrils 2 (two) times daily as needed for rhinitis. Use in each nostril as directed      Fluticasone  Propionate (XHANCE ) 93 MCG/ACT EXHU 1-2 sprays per nostril twice a day. 16 mL 5   ibuprofen  (ADVIL ,MOTRIN ) 600 MG tablet Take 1 tablet (600 mg total) by mouth every 6 (six) hours as needed for moderate pain. 30 tablet 0   loratadine (CLARITIN) 10 MG tablet Take 10 mg by mouth daily as needed for allergies.      triamcinolone  cream (KENALOG ) 0.1 % Apply 1 Application topically 2 (two) times daily. 30 g 1   No current facility-administered medications for this visit.   Allergies: Allergies  Allergen Reactions   Other Other (See Comments)   Social History: Social History   Socioeconomic History   Marital status: Married    Spouse name: Not on file   Number of children: Not on file   Years of education: Not on file   Highest education level: Not on file  Occupational History   Not on file  Tobacco Use   Smoking status: Never   Smokeless tobacco: Never  Vaping Use   Vaping status: Never Used  Substance and Sexual Activity   Alcohol use: Not Currently    Comment: rarely   Drug use: No   Sexual activity: Yes    Partners: Male  Other Topics Concern   Not on file  Social History Narrative   Not on file   Social Drivers of Health   Financial Resource Strain: Not on file  Food Insecurity: Not on file  Transportation Needs: Not on file  Physical Activity: Not on file  Stress: Not on file  Social Connections: Not on file   Lives in a house. Smoking: denies Occupation: stay at home  Environmental History: Water Damage/mildew in the house: not sure Carpet in the family room: no Carpet in the bedroom: no Heating: electric Cooling: central Pet: yes 3 rabbits x 2 yrs, 2 dogs x 9 yrs, x 1 yrs  Family History: Family History  Problem Relation Age of Onset   Arthritis Maternal Grandmother    Hypertension Maternal Grandmother    Arthritis Maternal Grandfather    Diabetes Maternal Grandfather    Heart disease Maternal Grandfather    Hypertension Maternal  Grandfather    Depression Maternal Grandfather    Hypertension Mother    Depression Mother    Cancer Mother        pancreatic   Hyperlipidemia Father    Depression Father    Hypertension Father    Cancer Cousin        breast   Hypertension Paternal Grandmother    Parkinson's disease Paternal Grandmother    Problem                               Relation Asthma  Children  Food allergy                          Children  Allergic rhino conjunctivitis     Children   Review of Systems  Constitutional:  Negative for appetite change, chills, fever and unexpected weight change.  HENT:  Positive for congestion, ear pain, hearing loss, postnasal drip and tinnitus. Negative for rhinorrhea.   Eyes:  Negative for itching.  Respiratory:  Negative for cough, chest tightness, shortness of breath and wheezing.   Cardiovascular:  Negative for chest pain.  Gastrointestinal:  Negative for abdominal pain.  Genitourinary:  Negative for difficulty urinating.  Skin:  Negative for rash.  Neurological:  Negative for headaches.    Objective: BP 128/86 (BP Location: Right Arm, Patient Position: Sitting, Cuff Size: Normal)   Pulse 85   Temp 98.7 F (37.1 C) (Temporal)   Resp 18   Ht 5' 1.81" (1.57 m)   Wt 179 lb 4.8 oz (81.3 kg)   LMP  (LMP Unknown)   SpO2 99%   BMI 33.00 kg/m  Body mass index is 33 kg/m. Physical Exam Vitals and nursing note reviewed.  Constitutional:      Appearance: Normal appearance. She is well-developed.  HENT:     Head: Normocephalic and atraumatic.     Right Ear: Tympanic membrane and external ear normal.     Left Ear: Tympanic membrane and external ear normal.     Nose: Nose normal.     Mouth/Throat:     Mouth: Mucous membranes are moist.     Pharynx: Oropharynx is clear.  Eyes:     Conjunctiva/sclera: Conjunctivae normal.  Cardiovascular:     Rate and Rhythm: Normal rate and regular rhythm.     Heart sounds: Normal heart  sounds. No murmur heard.    No friction rub. No gallop.  Pulmonary:     Effort: Pulmonary effort is normal.     Breath sounds: Normal breath sounds. No wheezing, rhonchi or rales.  Musculoskeletal:     Cervical back: Neck supple.  Skin:    General: Skin is warm.     Findings: No rash.  Neurological:     Mental Status: She is alert and oriented to person, place, and time.  Psychiatric:        Behavior: Behavior normal.   The plan was reviewed with the patient/family, and all questions/concerned were addressed.  It was my pleasure to see Katerine today and participate in her care. Please feel free to contact me with any questions or concerns.  Sincerely,  Eudelia Hero, DO Allergy & Immunology  [facility]  [place] office: [phone] [facility] office: [phone]

## 2024-01-03 ENCOUNTER — Telehealth: Payer: Self-pay

## 2024-01-03 NOTE — Telephone Encounter (Signed)
*  Asthma/Allergy  Pharmacy Patient Advocate Encounter   Received notification from Fax that prior authorization for Xhance  93MCG/ACT exhaler suspension  is required/requested.   Insurance verification completed.   The patient is insured through [employer] [facility] [employer] [facility] .   Per test claim: PA required; PA submitted to above mentioned insurance via CoverMyMeds Key/confirmation #/EOC [id number] Status is pending

## 2024-01-03 NOTE — Telephone Encounter (Signed)
 Approved today by [facility] [place]  Medicaid [place] Case: [id number], Status: Approved, Coverage Starts on: 01/03/2024 12:00:00 AM, Coverage Ends on: 01/02/2025 12:00:00 AM. Effective Date: 01/03/2024 Authorization Expiration Date: 01/02/2025

## 2024-01-17 ENCOUNTER — Encounter: Payer: Self-pay | Admitting: Allergy

## 2024-01-17 DIAGNOSIS — B999 Unspecified infectious disease: Secondary | ICD-10-CM

## 2024-01-17 DIAGNOSIS — L299 Pruritus, unspecified: Secondary | ICD-10-CM

## 2024-01-17 DIAGNOSIS — R21 Rash and other nonspecific skin eruption: Secondary | ICD-10-CM

## 2024-01-17 DIAGNOSIS — J3089 Other allergic rhinitis: Secondary | ICD-10-CM

## 2024-01-18 NOTE — Telephone Encounter (Signed)
 Done

## 2024-01-18 NOTE — Addendum Note (Signed)
 Addended by: Trudy Fusi on: 01/18/2024 04:39 PM   Modules accepted: Orders

## 2024-01-18 NOTE — Telephone Encounter (Signed)
 Please mail labs and [facility] lab to patient.  Angie Sanchez [address] [place], [address]

## 2024-01-23 ENCOUNTER — Ambulatory Visit: Admitting: Allergy

## 2024-02-05 ENCOUNTER — Ambulatory Visit: Payer: Self-pay | Admitting: Allergy

## 2024-02-05 ENCOUNTER — Telehealth: Payer: Self-pay | Admitting: Allergy

## 2024-02-05 ENCOUNTER — Encounter: Payer: Self-pay | Admitting: Allergy

## 2024-02-05 LAB — CBC WITH DIFFERENTIAL/PLATELET
Basophils Absolute: 0 x10E3/uL (ref 0.0–0.2)
Basos: 0 %
EOS (ABSOLUTE): 0.1 x10E3/uL (ref 0.0–0.4)
Eos: 1 %
Hematocrit: 41.9 % (ref 34.0–46.6)
Hemoglobin: 13.4 g/dL (ref 11.1–15.9)
Immature Grans (Abs): 0 x10E3/uL (ref 0.0–0.1)
Immature Granulocytes: 0 %
Lymphocytes Absolute: 2 x10E3/uL (ref 0.7–3.1)
Lymphs: 28 %
MCH: 28.4 pg (ref 26.6–33.0)
MCHC: 32 g/dL (ref 31.5–35.7)
MCV: 89 fL (ref 79–97)
Monocytes Absolute: 0.5 x10E3/uL (ref 0.1–0.9)
Monocytes: 7 %
Neutrophils Absolute: 4.4 x10E3/uL (ref 1.4–7.0)
Neutrophils: 64 %
Platelets: 255 x10E3/uL (ref 150–450)
RBC: 4.72 x10E6/uL (ref 3.77–5.28)
RDW: 12.8 % (ref 11.7–15.4)
WBC: 7 x10E3/uL (ref 3.4–10.8)

## 2024-02-05 LAB — TRYPTASE: Tryptase: 2.8 ug/L (ref 2.2–13.2)

## 2024-02-05 LAB — ALPHA-GAL PANEL
Allergen Lamb IgE: 0.19 kU/L — AB
Beef IgE: 0.23 kU/L — AB
IgE (Immunoglobulin E), Serum: 63 [IU]/mL (ref 6–495)
O215-IgE Alpha-Gal: <0.1 kU/L
Pork IgE: <0.1 kU/L

## 2024-02-05 LAB — THYROID CASCADE PROFILE: TSH: 1.8 u[IU]/mL (ref 0.450–4.500)

## 2024-02-05 LAB — FOOD ALLERGY PROFILE
Allergen Corn, IgE: <0.1 kU/L
Clam IgE: <0.1 kU/L
Codfish IgE: <0.1 kU/L
Egg White IgE: <0.1 kU/L
Milk IgE: 0.16 kU/L — AB
Peanut IgE: <0.1 kU/L
Scallop IgE: <0.1 kU/L
Sesame Seed IgE: <0.1 kU/L
Shrimp IgE: <0.1 kU/L
Soybean IgE: <0.1 kU/L
Walnut IgE: <0.1 kU/L
Wheat IgE: <0.1 kU/L

## 2024-02-05 LAB — ALLERGENS W/TOTAL IGE AREA 2
Alternaria Alternata IgE: <0.1 kU/L
Aspergillus Fumigatus IgE: <0.1 kU/L
Bermuda Grass IgE: <0.1 kU/L
Cat Dander IgE: 0.21 kU/L — AB
Cedar, Mountain IgE: <0.1 kU/L
Cladosporium Herbarum IgE: <0.1 kU/L
Cockroach, German IgE: <0.1 kU/L
Common Silver Birch IgE: 0.24 kU/L — AB
Cottonwood IgE: <0.1 kU/L
D Farinae IgE: 7.46 kU/L — AB
D Pteronyssinus IgE: 9.66 kU/L — AB
Dog Dander IgE: 0.57 kU/L — AB
Elm, American IgE: <0.1 kU/L
Johnson Grass IgE: 0.1 kU/L — AB
Maple/Box Elder IgE: 0.15 kU/L — AB
Mouse Urine IgE: <0.1 kU/L
Oak, White IgE: <0.1 kU/L
Pecan, Hickory IgE: 1.18 kU/L — AB
Penicillium Chrysogen IgE: <0.1 kU/L
Pigweed, Rough IgE: <0.1 kU/L
Ragweed, Short IgE: <0.1 kU/L
Sheep Sorrel IgE Qn: <0.1 kU/L
Timothy Grass IgE: 2.05 kU/L — AB
White Mulberry IgE: <0.1 kU/L

## 2024-02-05 LAB — COMPREHENSIVE METABOLIC PANEL WITH GFR
ALT: 16 IU/L (ref 0–32)
AST: 23 IU/L (ref 0–40)
Albumin: 4.6 g/dL (ref 3.9–4.9)
Alkaline Phosphatase: 50 IU/L (ref 44–121)
BUN/Creatinine Ratio: 19 (ref 9–23)
BUN: 12 mg/dL (ref 6–24)
Bilirubin Total: 0.2 mg/dL (ref 0.0–1.2)
CO2: 19 mmol/L — ABNORMAL LOW (ref 20–29)
Calcium: 9.6 mg/dL (ref 8.7–10.2)
Chloride: 103 mmol/L (ref 96–106)
Creatinine, Ser: 0.63 mg/dL (ref 0.57–1.00)
Globulin, Total: 2.7 g/dL (ref 1.5–4.5)
Glucose: 83 mg/dL (ref 70–99)
Potassium: 4.2 mmol/L (ref 3.5–5.2)
Sodium: 139 mmol/L (ref 134–144)
Total Protein: 7.3 g/dL (ref 6.0–8.5)
eGFR: 115 mL/min/1.73 (ref >59)

## 2024-02-05 LAB — ANTINUCLEAR ANTIBODIES, IFA: ANA Titer 1: POSITIVE — AB

## 2024-02-05 LAB — FANA STAINING PATTERNS: Speckled Pattern: 1:320 {titer} — ABNORMAL HIGH

## 2024-02-05 LAB — SEDIMENTATION RATE: Sed Rate: 3 mm/h (ref 0–32)

## 2024-02-05 LAB — C-REACTIVE PROTEIN: CRP: 1 mg/L (ref 0–10)

## 2024-02-05 NOTE — Telephone Encounter (Signed)
 Please place referral to rheumatology. Positive ANA with 1:320 speckled pattern.  Rash on lower legs responsive to prednisone . Please rule out any rheumatological conditions. Thank you.

## 2024-02-06 NOTE — Telephone Encounter (Signed)
 Per Patient:   Hi! Thank you for getting back to me on the test results. Can you please send a referral to [facility]? I'd like to see Dr. Strazanac.  (As long as they take my Medicaid)    Thank You!    Angie Sanchez

## 2024-03-18 NOTE — Telephone Encounter (Signed)
 Dr. Strazanac is the provider that Maricsa asked to see.  They closed out her Rheumatology referral due to Patient Refused

## 2024-07-14 ENCOUNTER — Encounter: Payer: Self-pay | Admitting: Dermatology

## 2024-07-14 ENCOUNTER — Ambulatory Visit: Admitting: Dermatology

## 2024-07-14 VITALS — BP 163/104 | HR 72

## 2024-07-14 DIAGNOSIS — Z79899 Other long term (current) drug therapy: Secondary | ICD-10-CM

## 2024-07-14 DIAGNOSIS — L649 Androgenic alopecia, unspecified: Secondary | ICD-10-CM

## 2024-07-14 DIAGNOSIS — L650 Telogen effluvium: Secondary | ICD-10-CM

## 2024-07-14 DIAGNOSIS — L719 Rosacea, unspecified: Secondary | ICD-10-CM | POA: Diagnosis not present

## 2024-07-14 MED ORDER — SAFETY SEAL MISCELLANEOUS MISC: 1.0000 | Freq: Every morning | 6 refills | Status: AC

## 2024-07-14 NOTE — Patient Instructions (Addendum)
 VISIT SUMMARY:  Today, you were seen for concerns about a rash and hair thinning. You were referred by your primary doctor due to a positive ANA test. Although you had a rash during the summer, it has since resolved. You also reported hair thinning, which started a few months ago but is now showing some regrowth. Additionally, you mentioned facial flushing and a skin tag on your eye.  YOUR PLAN:  -ANDROGENETIC ALOPECIA:  Androgenetic alopecia is a common form of hair loss that occurs in both men and women, often due to genetic factors.   You have been prescribed a topical solution containing minoxidil and finasteride to apply in the morning.   This treatment may take a couple of months to show significant regrowth, and daily application is necessary to prevent further hair loss. Be careful to apply it in the morning to avoid unwanted hair growth on your face.  -TELOGEN EFFLUVIUM (RESOLVING):  Telogen effluvium is a temporary form of hair loss that usually occurs after stress, illness, or fever. Your excessive shedding from a few months ago is likely due to this condition and is now resolving with some regrowth observed. We will continue to monitor your hair regrowth and manage the underlying androgenetic alopecia  Follow Up in Early May    Important Information  Due to recent changes in healthcare laws, you may see results of your pathology and/or laboratory studies on MyChart before the doctors have had a chance to review them. We understand that in some cases there may be results that are confusing or concerning to you. Please understand that not all results are received at the same time and often the doctors may need to interpret multiple results in order to provide you with the best plan of care or course of treatment. Therefore, we ask that you please give us  2 business days to thoroughly review all your results before contacting the office for clarification. Should we see a critical lab  result, you will be contacted sooner.   If You Need Anything After Your Visit  If you have any questions or concerns for your doctor, please call our main line at [phone] If no one answers, please leave a voicemail as directed and we will return your call as soon as possible. Messages left after 4 pm will be answered the following business day.   You may also send us  a message via MyChart. We typically respond to MyChart messages within 1-2 business days.  For prescription refills, please ask your pharmacy to contact our office. Our fax number is [phone].  If you have an urgent issue when the clinic is closed that cannot wait until the next business day, you can page your doctor at the number below.    Please note that while we do our best to be available for urgent issues outside of office hours, we are not available 24/7.   If you have an urgent issue and are unable to reach us , you may choose to seek medical care at your doctor's office, retail clinic, urgent care center, or emergency room.  If you have a medical emergency, please immediately call 911 or go to the emergency department. In the event of inclement weather, please call our main line at [phone] for an update on the status of any delays or closures.  Dermatology Medication Tips: Please keep the boxes that topical medications come in in order to help keep track of the instructions about where and how to use these. Pharmacies  typically print the medication instructions only on the boxes and not directly on the medication tubes.   If your medication is too expensive, please contact our office at [phone] or send us  a message through MyChart.   We are unable to tell what your co-pay for medications will be in advance as this is different depending on your insurance coverage. However, we may be able to find a substitute medication at lower cost or fill out paperwork to get insurance to cover a needed medication.    If a prior authorization is required to get your medication covered by your insurance company, please allow us  1-2 business days to complete this process.  Drug prices often vary depending on where the prescription is filled and some pharmacies may offer cheaper prices.  The website www.goodrx.com contains coupons for medications through different pharmacies. The prices here do not account for what the cost may be with help from insurance (it may be cheaper with your insurance), but the website can give you the price if you did not use any insurance.  - You can print the associated coupon and take it with your prescription to the pharmacy.  - You may also stop by our office during regular business hours and pick up a GoodRx coupon card.  - If you need your prescription sent electronically to a different pharmacy, notify our office through [facility] or by phone at [phone]

## 2024-07-14 NOTE — Progress Notes (Signed)
 New Patient Visit   Subjective  Angie Sanchez is a 38 y.o. female who presents for the following: Rash  Located at the face that she would like to have examined. Patient reports area is always red and puffy; sometimes the area feels warm but is never itchy Patient reports the areas have been there for 3 years.  She reports the areas are not bothersome. Patient rates irritation 1 out of 10.  She states that the areas have not spread. Patient reports she has not previously been treated for these areas.  Patient reports she has not tried any OTC medications Patient reports she has very sensitive skin and is even unable to wear make up. Patient reports that recent blood work reported different environmental allergies Patient reports she is currently on Prednisone   Patient made today's appointment to discuss rash that was scattered throughout the body earlier this year but is not currently having symptoms. Patient reports she did try Triamcinolone  and another steroid cream at the time that only helped a little but rash seems to have gone away after Prednisone  taper.  Patient would also like to discuss hairloss Patient reports she has noticed excessive shedding in the last 4 months and that hair is coming out in clumps Patient reports she uses rosemary mint suave and sometimes baby shampoo. Patient reports she does not use scalp or hair oils.   Patient would also like to discuss skin tag on left eye  The following portions of the chart were reviewed this encounter and updated as appropriate: medications, allergies, medical history  Review of Systems:  No other skin or systemic complaints except as noted in HPI or Assessment and Plan.  Objective  Well appearing patient in no apparent distress; mood and affect are within normal limits.  A focused examination was performed of the following areas: Face  Relevant exam findings are noted in the Assessment and Plan.                Assessment & Plan  ROSACEA Exam Mid face erythema with telangiectasias +/- scattered inflammatory papules on the face  Rosacea is a chronic progressive skin condition usually affecting the face of adults, causing redness and/or acne bumps. It is treatable but not curable. It sometimes affects the eyes (ocular rosacea) as well. It may respond to topical and/or systemic medication and can flare with stress, sun exposure, alcohol, exercise, topical steroids (including hydrocortisone /cortisone 10) and some foods.  Daily application of broad spectrum spf 30+ sunscreen to face is recommended to reduce flares.  Patient denies grittiness of the eyes  Treatment Plan  Recommended Vanicream or Avene Tolerance to use as daily face moisturizer to help with dryness and irritation - samples given  TELOGEN EFFLUVIUM Exam: Diffuse thinning of hair, negative hair pull test.  Telogen effluvium is a benign, self-limited condition causing increased hair shedding usually for several months. It does not progress to baldness, and the hair eventually grows back on its own. It can be triggered by recent illness, recent surgery, thyroid  disease, low iron stores, vitamin D  deficiency, fad diets or rapid weight loss, hormonal changes such as pregnancy or birth control pills, and some medication. Usually the hair loss starts 2-3 months after the illness or health change. Rarely, it can continue for longer than a year.  ANDROGENETIC ALOPECIA (FEMALE PATTERN HAIR LOSS) Exam: Diffuse thinning of the crown and widening of the midline part with retention of the frontal hairline   Female Androgenic Alopecia is a  chronic condition related to genetics and/or hormonal changes.  In women androgenetic alopecia is commonly associated with menopause but may occur any time after puberty.  It causes hair thinning primarily on the crown with widening of the part and temporal hairline recession.  Treatment Plan: Prescribed  Hormonic Hair Solution (Minoxidil 8% and Finasteride 0.05%) from [employer] to use every morning   Long term medication management.  Patient is using long term (months to years) prescription medication  to control their dermatologic condition.  These medications require periodic monitoring to evaluate for efficacy and side effects and may require periodic laboratory monitoring.     ANDROGENIC ALOPECIA   This Visit - Safety Seal Miscellaneous MISC - 1 Application by Does not apply route in the morning. Medication Name: Hormonic Hair Solution (Minoxidil 8% and Finasteride 0.05%)  Return in about 5 months (around 12/12/2024) for Alopecia .  LILLETTE Lyle Cords, am acting as a neurosurgeon for [employer], DO .   Documentation: I have reviewed the above documentation for accuracy and completeness, and I agree with the above.  Delon Lenis, DO
